# Patient Record
Sex: Female | Born: 1970 | Race: White | Hispanic: No | Marital: Married | Smoking: Former smoker
Health system: Southern US, Community
[De-identification: ages and names within clinical notes are randomized; demographics above are authoritative.]

## PROBLEM LIST (undated history)

## (undated) DIAGNOSIS — K589 Irritable bowel syndrome without diarrhea: Secondary | ICD-10-CM

## (undated) DIAGNOSIS — E039 Hypothyroidism, unspecified: Secondary | ICD-10-CM

## (undated) DIAGNOSIS — I471 Supraventricular tachycardia, unspecified: Secondary | ICD-10-CM

## (undated) DIAGNOSIS — R0789 Other chest pain: Secondary | ICD-10-CM

## (undated) DIAGNOSIS — I2510 Atherosclerotic heart disease of native coronary artery without angina pectoris: Secondary | ICD-10-CM

## (undated) DIAGNOSIS — R002 Palpitations: Secondary | ICD-10-CM

## (undated) DIAGNOSIS — M542 Cervicalgia: Secondary | ICD-10-CM

## (undated) DIAGNOSIS — S134XXA Sprain of ligaments of cervical spine, initial encounter: Secondary | ICD-10-CM

## (undated) DIAGNOSIS — E785 Hyperlipidemia, unspecified: Secondary | ICD-10-CM

## (undated) DIAGNOSIS — E669 Obesity, unspecified: Secondary | ICD-10-CM

## (undated) DIAGNOSIS — R202 Paresthesia of skin: Secondary | ICD-10-CM

## (undated) HISTORY — PX: PARTIAL HYSTERECTOMY: SHX80

## (undated) HISTORY — DX: Sprain of ligaments of cervical spine, initial encounter: S13.4XXA

## (undated) HISTORY — DX: Atherosclerotic heart disease of native coronary artery without angina pectoris: I25.10

## (undated) HISTORY — DX: Cervicalgia: M54.2

## (undated) HISTORY — DX: Paresthesia of skin: R20.2

## (undated) HISTORY — DX: Hypothyroidism, unspecified: E03.9

## (undated) HISTORY — DX: Hyperlipidemia, unspecified: E78.5

## (undated) HISTORY — DX: Irritable bowel syndrome, unspecified: K58.9

## (undated) HISTORY — DX: Palpitations: R00.2

## (undated) HISTORY — DX: Obesity, unspecified: E66.9

## (undated) HISTORY — DX: Other chest pain: R07.89

---

## 2001-05-19 DIAGNOSIS — S134XXA Sprain of ligaments of cervical spine, initial encounter: Secondary | ICD-10-CM

## 2001-05-19 HISTORY — DX: Sprain of ligaments of cervical spine, initial encounter: S13.4XXA

## 2016-05-05 ENCOUNTER — Encounter: Payer: Self-pay | Admitting: Internal Medicine

## 2016-05-14 ENCOUNTER — Other Ambulatory Visit (HOSPITAL_COMMUNITY): Payer: Self-pay | Admitting: Internal Medicine

## 2016-05-14 DIAGNOSIS — Z1231 Encounter for screening mammogram for malignant neoplasm of breast: Secondary | ICD-10-CM

## 2016-05-29 ENCOUNTER — Encounter: Payer: Self-pay | Admitting: Gastroenterology

## 2016-05-29 ENCOUNTER — Other Ambulatory Visit: Payer: Self-pay

## 2016-05-29 ENCOUNTER — Ambulatory Visit (INDEPENDENT_AMBULATORY_CARE_PROVIDER_SITE_OTHER): Payer: BLUE CROSS/BLUE SHIELD | Admitting: Gastroenterology

## 2016-05-29 VITALS — BP 125/81 | HR 91 | Temp 98.3°F | Ht 67.0 in | Wt 180.4 lb

## 2016-05-29 DIAGNOSIS — R194 Change in bowel habit: Secondary | ICD-10-CM

## 2016-05-29 DIAGNOSIS — K589 Irritable bowel syndrome without diarrhea: Secondary | ICD-10-CM | POA: Insufficient documentation

## 2016-05-29 DIAGNOSIS — Z8371 Family history of colonic polyps: Secondary | ICD-10-CM | POA: Diagnosis not present

## 2016-05-29 DIAGNOSIS — K581 Irritable bowel syndrome with constipation: Secondary | ICD-10-CM | POA: Diagnosis not present

## 2016-05-29 MED ORDER — PEG-KCL-NACL-NASULF-NA ASC-C 100 G PO SOLR: 1.0000 | ORAL | 0 refills | Status: DC

## 2016-05-29 NOTE — Patient Instructions (Signed)
No PA needed for Colonoscopy. Ref# [id number].

## 2016-05-29 NOTE — Patient Instructions (Signed)
We have scheduled you for a colonoscopy with Dr. Jena Gaussourk in the near future.  You may take Linzess 1 capsule on an empty stomach daily as needed for constipation.

## 2016-05-29 NOTE — Progress Notes (Signed)
Primary Care Physician:  Dwana MelenaZack Hall, MD Primary Gastroenterologist:  Dr. Jena Gaussourk   Chief Complaint  Patient presents with  . Irritable Bowel Syndrome    HPI:   Patricia [name] is a 46 y.o. female presenting today at the request of her PCP secondary to IBS.   Over last year or so, stool shape has changed. Usually has constipation but not always. Has a BM once day. Sometimes straining. Stool habits go back and forth. May have some loose stools occasionally but predominantly constipation. Has gas pains almost all the time. Chronic. Prescribed Bentyl from another provider but doesn't help symptoms. Will eat prunes. Will take a Senna if needed. If loose doesn't do anything. Tries to eat high fiber but makes gassier. Tries to eat grainy cereal but always suffers. Will see scant hematochezia at times, states she has a history of hemorrhoids. When seeing blood, hemorrhoids are hurting. Only once a year or so. No prior colonoscopy. Stool is flat, not formed, which concerned her. This has changed over the past year. Has gained 20 lbs over the last year. Probiotic makes her sick. Likes to avoid prescriptive agents.    Past Medical History:  Diagnosis Date  . Hypothyroidism     Past Surgical History:  Procedure Laterality Date  . CESAREAN SECTION    . PARTIAL HYSTERECTOMY      Current Outpatient Prescriptions  Medication Sig Dispense Refill  . levothyroxine (SYNTHROID, LEVOTHROID) 88 MCG tablet TAKE 1 TABLET BY MOUTH ONCE DAILY ON AN EMPTY STOMACH WITH A GLASS OF WATER AT LEAST 30-60 MINUTES BEFORE BREAKFAST     No current facility-administered medications for this visit.     Allergies as of 05/29/2016 - Review Complete 05/29/2016  Allergen Reaction Noted  . Sulfa antibiotics Other (See Comments) 01/04/2015    Family History  Problem Relation Age of Onset  . Colon polyps Father [id number]    Social History   Social History  . Marital status: Married    Spouse name: N/A  . Number of  children: N/A  . Years of education: N/A   Occupational History  . Nurse    Social History Main Topics  . Smoking status: Never Smoker  . Smokeless tobacco: Never Used  . Alcohol use Yes     Comment: socially  . Drug use: No  . Sexual activity: Not on file   Other Topics Concern  . Not on file   Social History Narrative  . No narrative on file    Review of Systems: Gen: +weight gain  CV: Denies chest pain, heart palpitations, peripheral edema, syncope.  Resp: Denies shortness of breath at rest or with exertion. Denies wheezing or cough.  GI: see HPI  GU : Denies urinary burning, urinary frequency, urinary hesitancy MS: herniated disc in neck  Derm: Denies rash, itching, dry skin Psych: Denies depression, anxiety, memory loss, and confusion Heme: see HPI   Physical Exam: BP 125/81   Pulse 91   Temp 98.3 F (36.8 C) (Oral)   Ht 5\' 7"  (1.702 m)   Wt 180 lb 6.4 oz (81.8 kg)   BMI 28.25 kg/m  General:   Alert and oriented. Pleasant and cooperative. Well-nourished and well-developed.  Head:  Normocephalic and atraumatic. Eyes:  Without icterus, sclera clear and conjunctiva pink.  Ears:  Normal auditory acuity. Nose:  No deformity, discharge,  or lesions. Lungs:  Clear to auscultation bilaterally. No wheezes, rales, or rhonchi. No distress.  Heart:  S1, S2  present without murmurs appreciated.  Abdomen:  +BS, soft, non-tender and non-distended. No HSM noted. No guarding or rebound. No masses appreciated.  Rectal:  Deferred  Msk:  Symmetrical without gross deformities. Normal posture. Extremities:  Without edema. Neurologic:  Alert and  oriented x4;  grossly normal neurologically. Psych:  Alert and cooperative. Normal mood and affect.

## 2016-05-29 NOTE — Assessment & Plan Note (Signed)
46 year old female presenting today with long-standing history of constipation-predominant IBS, and patient is concerned regarding change in bowel habits. Reports stool shape changing to appear more "flat". Chronic low-volume hematochezia noted in setting of rectal irritation only about once a year, which is likely benign anorectal source. She does report that her father has a history of numerous polyps, undergoing surveillance yearly. With her family history of polyps and change in bowel habits, will proceed with screening colonoscopy.   Proceed with TCS with Dr. Jena Gaussourk in near future: the risks, benefits, and alternatives have been discussed with the patient in detail. The patient states understanding and desires to proceed. Linzess 72 mcg once daily provided as samples. Patient would like to avoid prescriptive agents if possible, but she is willing to try this is necessary.

## 2016-05-29 NOTE — Progress Notes (Signed)
CC'D TO PCP °

## 2016-05-29 NOTE — Assessment & Plan Note (Signed)
Constipation-predominant. Colonoscopy as planned due to change in bowel habits, FH colon polyps.

## 2016-06-02 ENCOUNTER — Ambulatory Visit (HOSPITAL_COMMUNITY)
Admission: RE | Admit: 2016-06-02 | Discharge: 2016-06-02 | Disposition: A | Discharge status: Home / Self Care | Payer: BLUE CROSS/BLUE SHIELD | Source: Ambulatory Visit | Attending: Internal Medicine | Admitting: Internal Medicine

## 2016-06-02 DIAGNOSIS — Z1231 Encounter for screening mammogram for malignant neoplasm of breast: Secondary | ICD-10-CM | POA: Diagnosis not present

## 2016-06-06 ENCOUNTER — Other Ambulatory Visit: Payer: Self-pay | Admitting: Internal Medicine

## 2016-06-06 DIAGNOSIS — R928 Other abnormal and inconclusive findings on diagnostic imaging of breast: Secondary | ICD-10-CM

## 2016-06-18 ENCOUNTER — Encounter (HOSPITAL_COMMUNITY): Payer: Self-pay | Admitting: *Deleted

## 2016-06-18 ENCOUNTER — Encounter (HOSPITAL_COMMUNITY)
Admission: RE | Disposition: A | Discharge status: Home / Self Care | Payer: Self-pay | Source: Ambulatory Visit | Attending: Internal Medicine

## 2016-06-18 ENCOUNTER — Ambulatory Visit (HOSPITAL_COMMUNITY)
Admission: RE | Admit: 2016-06-18 | Discharge: 2016-06-18 | Disposition: A | Discharge status: Home / Self Care | Payer: BLUE CROSS/BLUE SHIELD | Source: Ambulatory Visit | Attending: Internal Medicine | Admitting: Internal Medicine

## 2016-06-18 DIAGNOSIS — K640 First degree hemorrhoids: Secondary | ICD-10-CM | POA: Insufficient documentation

## 2016-06-18 DIAGNOSIS — Z8371 Family history of colonic polyps: Secondary | ICD-10-CM | POA: Insufficient documentation

## 2016-06-18 DIAGNOSIS — R194 Change in bowel habit: Secondary | ICD-10-CM

## 2016-06-18 DIAGNOSIS — K589 Irritable bowel syndrome without diarrhea: Secondary | ICD-10-CM | POA: Insufficient documentation

## 2016-06-18 DIAGNOSIS — R195 Other fecal abnormalities: Secondary | ICD-10-CM | POA: Diagnosis not present

## 2016-06-18 DIAGNOSIS — E039 Hypothyroidism, unspecified: Secondary | ICD-10-CM | POA: Insufficient documentation

## 2016-06-18 DIAGNOSIS — Z83719 Family history of colon polyps, unspecified: Secondary | ICD-10-CM

## 2016-06-18 DIAGNOSIS — Z79899 Other long term (current) drug therapy: Secondary | ICD-10-CM | POA: Insufficient documentation

## 2016-06-18 HISTORY — PX: COLONOSCOPY: SHX5424

## 2016-06-18 SURGERY — COLONOSCOPY
Anesthesia: Moderate Sedation

## 2016-06-18 MED ORDER — ONDANSETRON HCL 4 MG/2ML IJ SOLN
INTRAMUSCULAR | Status: DC | PRN
  Administered 2016-06-18: 4 mg via INTRAVENOUS

## 2016-06-18 MED ORDER — MEPERIDINE HCL 100 MG/ML IJ SOLN
INTRAMUSCULAR | Status: DC | PRN
  Administered 2016-06-18: 50 mg via INTRAVENOUS
  Administered 2016-06-18 (×2): 25 mg via INTRAVENOUS
  Administered 2016-06-18: 50 mg via INTRAVENOUS

## 2016-06-18 MED ORDER — MEPERIDINE HCL 100 MG/ML IJ SOLN
INTRAMUSCULAR | Status: AC
  Filled 2016-06-18: qty 2

## 2016-06-18 MED ORDER — ONDANSETRON HCL 4 MG/2ML IJ SOLN
INTRAMUSCULAR | Status: AC
  Filled 2016-06-18: qty 2

## 2016-06-18 MED ORDER — MIDAZOLAM HCL 5 MG/5ML IJ SOLN
INTRAMUSCULAR | Status: DC | PRN
  Administered 2016-06-18 (×2): 1 mg via INTRAVENOUS
  Administered 2016-06-18: 2 mg via INTRAVENOUS
  Administered 2016-06-18: 1 mg via INTRAVENOUS
  Administered 2016-06-18: 2 mg via INTRAVENOUS

## 2016-06-18 MED ORDER — MIDAZOLAM HCL 5 MG/5ML IJ SOLN
INTRAMUSCULAR | Status: AC
  Filled 2016-06-18: qty 10

## 2016-06-18 MED ORDER — SODIUM CHLORIDE 0.9 % IV SOLN
INTRAVENOUS | Status: DC
  Administered 2016-06-18: 10:00:00 via INTRAVENOUS

## 2016-06-18 MED ORDER — STERILE WATER FOR IRRIGATION IR SOLN
Status: DC | PRN
  Administered 2016-06-18: 11:00:00

## 2016-06-18 NOTE — Progress Notes (Signed)
Patricia [name] has had sedative medications today for a procedure.  This could potentially affect a drug screen for employement.

## 2016-06-18 NOTE — Interval H&P Note (Signed)
History and Physical Interval Note:  06/18/2016 10:27 AM  Patricia [name]  has presented today for surgery, with the diagnosis of family history polyps, change in bowel habits  The various methods of treatment have been discussed with the patient and family. After consideration of risks, benefits and other options for treatment, the patient has consented to  Procedure(s) with comments: COLONOSCOPY (N/A) - 11:15 am as a surgical intervention .  The patient's history has been reviewed, patient examined, no change in status, stable for surgery.  I have reviewed the patient's chart and labs.  Questions were answered to the patient's satisfaction.     Patricia [name]  No change. Diagnostic colonoscopy for plan.  The risks, benefits, limitations, alternatives and imponderables have been reviewed with the patient. Questions have been answered. All parties are agreeable.

## 2016-06-18 NOTE — OR Nursing (Signed)
Patient's husband took patient's wedding band and placed it in his left front pants pocket

## 2016-06-18 NOTE — Op Note (Signed)
[facility] Patient Name: Patricia [name] Procedure Date: 06/18/2016 10:00 AM MRN: [id number] Date of Birth: 09-23-1970 Attending MD: Gennette Pac , MD CSN: [id number] Age: 45 Admit Type: Outpatient Procedure:                Colonoscopy - diagnostic Indications:              Change in bowel habits, Change in stool caliber Providers:                Gennette Pac, MD, Loma Messing B. Mathis Fare RN, RN,                            Dyann Ruddle Referring MD:              Medicines:                Midazolam 7 mg IV, Meperidine 150 mg IV Complications:            No immediate complications. Estimated Blood Loss:     Estimated blood loss was minimal. Procedure:                Pre-Anesthesia Assessment:                           - Prior to the procedure, a History and Physical                            was performed, and patient medications and                            allergies were reviewed. The patient's tolerance of                            previous anesthesia was also reviewed. The risks                            and benefits of the procedure and the sedation                            options and risks were discussed with the patient.                            All questions were answered, and informed consent                            was obtained. Prior Anticoagulants: The patient has                            taken no previous anticoagulant or antiplatelet                            agents. ASA Grade Assessment: II - A patient with                            mild systemic disease. After reviewing the risks  and benefits, the patient was deemed in                            satisfactory condition to undergo the procedure.                           After obtaining informed consent, the colonoscope                            was passed under direct vision. Throughout the                            procedure, the patient's blood pressure, pulse,  and                            oxygen saturations were monitored continuously. The                            EC-[id number] ([id number]) scope was introduced through                            the anus and advanced to the the cecum, identified                            by appendiceal orifice and ileocecal valve. The                            ileocecal valve, appendiceal orifice, and rectum                            were photographed. The entire colon was well                            visualized. The colonoscopy was performed without                            difficulty. The colonoscopy was performed without                            difficulty. The quality of the bowel preparation                            was adequate. Scope In: 10:45:13 AM Scope Out: 11:01:48 AM Scope Withdrawal Time: 0 hours 8 minutes 9 seconds  Total Procedure Duration: 0 hours 16 minutes 35 seconds  Findings:      The perianal and digital rectal examinations were normal.      The colon (entire examined portion) appeared normal.      No additional abnormalities were found on retroflexion.      Internal hemorrhoids were found during retroflexion. The hemorrhoids       were Grade I (internal hemorrhoids that do not prolapse). Impression:               - The entire examined colon is normal.                           -  Internal hemorrhoids.                           - No specimens collected. I suspect hemorrhoidal                            bleeding in the setting of constipation. Moderate Sedation:      Moderate (conscious) sedation was administered by the endoscopy nurse       and supervised by the endoscopist. The following parameters were       monitored: oxygen saturation, heart rate, blood pressure, respiratory       rate, EKG, adequacy of pulmonary ventilation, and response to care.       Total physician intraservice time was 9 minutes. Recommendation:           - Patient has a contact number available for                             emergencies. The signs and symptoms of potential                            delayed complications were discussed with the                            patient. Return to normal activities tomorrow.                            Written discharge instructions were provided to the                            patient.                           - Resume previous diet. Begin Benefiber 1                            tablespoon twice daily. Trial Linzess 72 daily as                            needed for constipation                           - Repeat colonoscopy in 5 years for screening                            purposes.                           - Return to GI office in 3 months.                           - Continue present medications. Procedure Code(s):        --- Professional ---                           [phone], Colonoscopy, flexible; diagnostic, including  collection of specimen(s) by brushing or washing,                            when performed (separate procedure) Diagnosis Code(s):        --- Professional ---                           K64.0, First degree hemorrhoids                           R19.4, Change in bowel habit                           R19.5, Other fecal abnormalities CPT copyright 2016 American Medical Association. All rights reserved. The codes documented in this report are preliminary and upon coder review may  be revised to meet current compliance requirements. [name]. [name], MD Gennette Pac, MD 06/18/2016 11:14:56 AM This report has been signed electronically. Number of Addenda: 0

## 2016-06-18 NOTE — Discharge Instructions (Addendum)
Colonoscopy Discharge Instructions  Read the instructions outlined below and refer to this sheet in the next few weeks. These discharge instructions provide you with general information on caring for yourself after you leave the hospital. Your doctor may also give you specific instructions. While your treatment has been planned according to the most current medical practices available, unavoidable complications occasionally occur. If you have any problems or questions after discharge, call Dr. Jena Gauss at [phone]. ACTIVITY  You may resume your regular activity, but move at a slower pace for the next 24 hours.   Take frequent rest periods for the next 24 hours.   Walking will help get rid of the air and reduce the bloated feeling in your belly (abdomen).   No driving for 24 hours (because of the medicine (anesthesia) used during the test).    Do not sign any important legal documents or operate any machinery for 24 hours (because of the anesthesia used during the test).  NUTRITION  Drink plenty of fluids.   You may resume your normal diet as instructed by your doctor.   Begin with a light meal and progress to your normal diet. Heavy or fried foods are harder to digest and may make you feel sick to your stomach (nauseated).   Avoid alcoholic beverages for 24 hours or as instructed.  MEDICATIONS  You may resume your normal medications unless your doctor tells you otherwise.  WHAT YOU CAN EXPECT TODAY  Some feelings of bloating in the abdomen.   Passage of more gas than usual.   Spotting of blood in your stool or on the toilet paper.  IF YOU HAD POLYPS REMOVED DURING THE COLONOSCOPY:  No aspirin products for 7 days or as instructed.   No alcohol for 7 days or as instructed.   Eat a soft diet for the next 24 hours.  FINDING OUT THE RESULTS OF YOUR TEST Not all test results are available during your visit. If your test results are not back during the visit, make an appointment  with your caregiver to find out the results. Do not assume everything is normal if you have not heard from your caregiver or the medical facility. It is important for you to follow up on all of your test results.  SEEK IMMEDIATE MEDICAL ATTENTION IF:  You have more than a spotting of blood in your stool.   Your belly is swollen (abdominal distention).   You are nauseated or vomiting.   You have a temperature over 101.   You have abdominal pain or discomfort that is severe or gets worse throughout the day.   Repeat colonoscopy in 5 years  Constipation and hemorrhoid  information provided  Begin Benefiber 1 tablespoon twice daily  Use Linzess 72 daily as needed for constipation  Office visit with [place] in 3 months      Constipation, Adult Constipation is when a person has fewer bowel movements in a week than normal, has difficulty having a bowel movement, or has stools that are dry, hard, or larger than normal. Constipation may be caused by an underlying condition. It may become worse with age if a person takes certain medicines and does not take in enough fluids. Follow these instructions at home: Eating and drinking  Eat foods that have a lot of fiber, such as fresh fruits and vegetables, whole grains, and beans.  Limit foods that are high in fat, low in fiber, or overly processed, such as french fries, hamburgers, cookies, candies, and soda.  Drink enough fluid to keep your urine clear or pale yellow. General instructions  Exercise regularly or as told by your health care provider.  Go to the restroom when you have the urge to go. Do not hold it in.  Take over-the-counter and prescription medicines only as told by your health care provider. These include any fiber supplements.  Practice pelvic floor retraining exercises, such as deep breathing while relaxing the lower abdomen and pelvic floor relaxation during bowel movements.  Watch your condition for any changes.  Keep  all follow-up visits as told by your health care provider. This is important. Contact a health care provider if:  You have pain that gets worse.  You have a fever.  You do not have a bowel movement after 4 days.  You vomit.  You are not hungry.  You lose weight.  You are bleeding from the anus.  You have thin, pencil-like stools. Get help right away if:  You have a fever and your symptoms suddenly get worse.  You leak stool or have blood in your stool.  Your abdomen is bloated.  You have severe pain in your abdomen.  You feel dizzy or you faint. This information is not intended to replace advice given to you by your health care provider. Make sure you discuss any questions you have with your health care provider. Document Released: 02/01/2004 Document Revised: 11/23/2015 Document Reviewed: 10/24/2015 Elsevier Interactive Patient Education  2017 [employer]    Hemorrhoids Hemorrhoids are swollen veins in and around the rectum or anus. There are two types of hemorrhoids:  Internal hemorrhoids. These occur in the veins that are just inside the rectum. They may poke through to the outside and become irritated and painful.  External hemorrhoids. These occur in the veins that are outside of the anus and can be felt as a painful swelling or hard lump near the anus. Most hemorrhoids do not cause serious problems, and they can be managed with home treatments such as diet and lifestyle changes. If home treatments do not help your symptoms, procedures can be done to shrink or remove the hemorrhoids. What are the causes? This condition is caused by increased pressure in the anal area. This pressure may result from various things, including:  Constipation.  Straining to have a bowel movement.  Diarrhea.  Pregnancy.  Obesity.  Sitting for long periods of time.  Heavy lifting or other activity that causes you to strain.  Anal sex. What are the signs or  symptoms? Symptoms of this condition include:  Pain.  Anal itching or irritation.  Rectal bleeding.  Leakage of stool (feces).  Anal swelling.  One or more lumps around the anus. How is this diagnosed? This condition can often be diagnosed through a visual exam. Other exams or tests may also be done, such as:  Examination of the rectal area with a gloved hand (digital rectal exam).  Examination of the anal canal using a small tube (anoscope).  A blood test, if you have lost a significant amount of blood.  A test to look inside the colon (sigmoidoscopy or colonoscopy). How is this treated? This condition can usually be treated at home. However, various procedures may be done if dietary changes, lifestyle changes, and other home treatments do not help your symptoms. These procedures can help make the hemorrhoids smaller or remove them completely. Some of these procedures involve surgery, and others do not. Common procedures include:  Rubber band ligation. Rubber bands are placed at  the base of the hemorrhoids to cut off the blood supply to them.  Sclerotherapy. Medicine is injected into the hemorrhoids to shrink them.  Infrared coagulation. A type of light energy is used to get rid of the hemorrhoids.  Hemorrhoidectomy surgery. The hemorrhoids are surgically removed, and the veins that supply them are tied off.  Stapled hemorrhoidopexy surgery. A circular stapling device is used to remove the hemorrhoids and use staples to cut off the blood supply to them. Follow these instructions at home: Eating and drinking  Eat foods that have a lot of fiber in them, such as whole grains, beans, nuts, fruits, and vegetables. Ask your health care provider about taking products that have added fiber (fiber supplements).  Drink enough fluid to keep your urine clear or pale yellow. Managing pain and swelling  Take warm sitz baths for 20 minutes, 3-4 times a day to ease pain and  discomfort.  If directed, apply ice to the affected area. Using ice packs between sitz baths may be helpful.  Put ice in a plastic bag.  Place a towel between your skin and the bag.  Leave the ice on for 20 minutes, 2-3 times a day. General instructions  Take over-the-counter and prescription medicines only as told by your health care provider.  Use medicated creams or suppositories as told.  Exercise regularly.  Go to the bathroom when you have the urge to have a bowel movement. Do not wait.  Avoid straining to have bowel movements.  Keep the anal area dry and clean. Use wet toilet paper or moist towelettes after a bowel movement.  Do not sit on the toilet for long periods of time. This increases blood pooling and pain. Contact a health care provider if:  You have increasing pain and swelling that are not controlled by treatment or medicine.  You have uncontrolled bleeding.  You have difficulty having a bowel movement, or you are unable to have a bowel movement.  You have pain or inflammation outside the area of the hemorrhoids. This information is not intended to replace advice given to you by your health care provider. Make sure you discuss any questions you have with your health care provider. Document Released: 05/02/2000 Document Revised: 10/03/2015 Document Reviewed: 01/17/2015 Elsevier Interactive Patient Education  2017 [employer]

## 2016-06-18 NOTE — H&P (View-Only) (Signed)
Primary Care Physician:  Dwana MelenaZack Hall, MD Primary Gastroenterologist:  Dr. Jena Gaussourk   Chief Complaint  Patient presents with  . Irritable Bowel Syndrome    HPI:   Patricia [name] is a 46 y.o. female presenting today at the request of her PCP secondary to IBS.   Over last year or so, stool shape has changed. Usually has constipation but not always. Has a BM once day. Sometimes straining. Stool habits go back and forth. May have some loose stools occasionally but predominantly constipation. Has gas pains almost all the time. Chronic. Prescribed Bentyl from another provider but doesn't help symptoms. Will eat prunes. Will take a Senna if needed. If loose doesn't do anything. Tries to eat high fiber but makes gassier. Tries to eat grainy cereal but always suffers. Will see scant hematochezia at times, states she has a history of hemorrhoids. When seeing blood, hemorrhoids are hurting. Only once a year or so. No prior colonoscopy. Stool is flat, not formed, which concerned her. This has changed over the past year. Has gained 20 lbs over the last year. Probiotic makes her sick. Likes to avoid prescriptive agents.    Past Medical History:  Diagnosis Date  . Hypothyroidism     Past Surgical History:  Procedure Laterality Date  . CESAREAN SECTION    . PARTIAL HYSTERECTOMY      Current Outpatient Prescriptions  Medication Sig Dispense Refill  . levothyroxine (SYNTHROID, LEVOTHROID) 88 MCG tablet TAKE 1 TABLET BY MOUTH ONCE DAILY ON AN EMPTY STOMACH WITH A GLASS OF WATER AT LEAST 30-60 MINUTES BEFORE BREAKFAST     No current facility-administered medications for this visit.     Allergies as of 05/29/2016 - Review Complete 05/29/2016  Allergen Reaction Noted  . Sulfa antibiotics Other (See Comments) 01/04/2015    Family History  Problem Relation Age of Onset  . Colon polyps Father [id number]    Social History   Social History  . Marital status: Married    Spouse name: N/A  . Number of  children: N/A  . Years of education: N/A   Occupational History  . Nurse    Social History Main Topics  . Smoking status: Never Smoker  . Smokeless tobacco: Never Used  . Alcohol use Yes     Comment: socially  . Drug use: No  . Sexual activity: Not on file   Other Topics Concern  . Not on file   Social History Narrative  . No narrative on file    Review of Systems: Gen: +weight gain  CV: Denies chest pain, heart palpitations, peripheral edema, syncope.  Resp: Denies shortness of breath at rest or with exertion. Denies wheezing or cough.  GI: see HPI  GU : Denies urinary burning, urinary frequency, urinary hesitancy MS: herniated disc in neck  Derm: Denies rash, itching, dry skin Psych: Denies depression, anxiety, memory loss, and confusion Heme: see HPI   Physical Exam: BP 125/81   Pulse 91   Temp 98.3 F (36.8 C) (Oral)   Ht 5\' 7"  (1.702 m)   Wt 180 lb 6.4 oz (81.8 kg)   BMI 28.25 kg/m  General:   Alert and oriented. Pleasant and cooperative. Well-nourished and well-developed.  Head:  Normocephalic and atraumatic. Eyes:  Without icterus, sclera clear and conjunctiva pink.  Ears:  Normal auditory acuity. Nose:  No deformity, discharge,  or lesions. Lungs:  Clear to auscultation bilaterally. No wheezes, rales, or rhonchi. No distress.  Heart:  S1, S2  present without murmurs appreciated.  Abdomen:  +BS, soft, non-tender and non-distended. No HSM noted. No guarding or rebound. No masses appreciated.  Rectal:  Deferred  Msk:  Symmetrical without gross deformities. Normal posture. Extremities:  Without edema. Neurologic:  Alert and  oriented x4;  grossly normal neurologically. Psych:  Alert and cooperative. Normal mood and affect.

## 2016-06-19 ENCOUNTER — Encounter (HOSPITAL_COMMUNITY): Payer: Self-pay | Admitting: Internal Medicine

## 2016-06-24 ENCOUNTER — Ambulatory Visit (HOSPITAL_COMMUNITY)
Admission: RE | Admit: 2016-06-24 | Discharge: 2016-06-24 | Disposition: A | Discharge status: Home / Self Care | Payer: BLUE CROSS/BLUE SHIELD | Source: Ambulatory Visit | Attending: Internal Medicine | Admitting: Internal Medicine

## 2016-06-24 DIAGNOSIS — N6320 Unspecified lump in the left breast, unspecified quadrant: Secondary | ICD-10-CM | POA: Diagnosis present

## 2016-06-24 DIAGNOSIS — R928 Other abnormal and inconclusive findings on diagnostic imaging of breast: Secondary | ICD-10-CM

## 2016-09-09 ENCOUNTER — Ambulatory Visit: Payer: BLUE CROSS/BLUE SHIELD | Admitting: Gastroenterology

## 2017-09-23 ENCOUNTER — Other Ambulatory Visit (HOSPITAL_COMMUNITY): Payer: Self-pay | Admitting: Internal Medicine

## 2017-09-23 DIAGNOSIS — Z1231 Encounter for screening mammogram for malignant neoplasm of breast: Secondary | ICD-10-CM

## 2017-10-16 ENCOUNTER — Encounter (HOSPITAL_COMMUNITY): Payer: Self-pay

## 2017-10-16 ENCOUNTER — Ambulatory Visit (HOSPITAL_COMMUNITY)
Admission: RE | Admit: 2017-10-16 | Discharge: 2017-10-16 | Disposition: A | Discharge status: Home / Self Care | Payer: BC Managed Care – PPO | Source: Ambulatory Visit | Attending: Internal Medicine | Admitting: Internal Medicine

## 2017-10-16 DIAGNOSIS — Z1231 Encounter for screening mammogram for malignant neoplasm of breast: Secondary | ICD-10-CM | POA: Diagnosis present

## 2018-01-22 ENCOUNTER — Ambulatory Visit: Payer: BC Managed Care – PPO | Admitting: Diagnostic Neuroimaging

## 2018-03-04 ENCOUNTER — Encounter: Payer: Self-pay | Admitting: *Deleted

## 2018-03-05 ENCOUNTER — Ambulatory Visit: Payer: BC Managed Care – PPO | Admitting: Diagnostic Neuroimaging

## 2018-03-05 ENCOUNTER — Encounter: Payer: Self-pay | Admitting: Diagnostic Neuroimaging

## 2018-03-05 VITALS — BP 133/85 | HR 97 | Ht 67.0 in | Wt 184.0 lb

## 2018-03-05 DIAGNOSIS — R200 Anesthesia of skin: Secondary | ICD-10-CM | POA: Diagnosis not present
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
## 2018-03-08 ENCOUNTER — Telehealth: Payer: Self-pay | Admitting: Diagnostic Neuroimaging

## 2018-03-08 NOTE — Telephone Encounter (Signed)
lvm for pt to call back about scheduling mri  [employer] Auth: [id number] (exp. 03/08/18 to 04/06/18)

## 2018-03-09 NOTE — Telephone Encounter (Signed)
Patient returned my call stating she needs an open MRI. I informed her I would fax the order to [facility] and they will reach out to there to schedule.

## 2018-04-01 ENCOUNTER — Ambulatory Visit (INDEPENDENT_AMBULATORY_CARE_PROVIDER_SITE_OTHER): Payer: BC Managed Care – PPO | Admitting: Diagnostic Neuroimaging

## 2018-04-01 ENCOUNTER — Encounter: Payer: BC Managed Care – PPO | Admitting: Diagnostic Neuroimaging

## 2018-04-01 DIAGNOSIS — R200 Anesthesia of skin: Secondary | ICD-10-CM

## 2018-04-01 DIAGNOSIS — Z0289 Encounter for other administrative examinations: Secondary | ICD-10-CM

## 2018-04-01 NOTE — Procedures (Signed)
[facility]  NCS (NERVE CONDUCTION STUDY) WITH EMG (ELECTROMYOGRAPHY) REPORT   STUDY DATE: 04/01/18 PATIENT NAME: Patricia [name] DOB: Oct 24, 1970 MRN: [id number]  ORDERING CLINICIAN: Joycelyn SchmidVikram Penumalli, MD   TECHNOLOGIST: Charlesetta IvoryBeau Handy  ELECTROMYOGRAPHER: Glenford BayleyVikram R. Penumalli, MD  CLINICAL INFORMATION: 47 year old female here for evaluation of upper extremity numbness (left worse than right) when raising arms above head.  FINDINGS: NERVE CONDUCTION STUDY: Bilateral median and ulnar motor responses are normal.  Bilateral ulnar F-wave latencies are normal.  Bilateral median and ulnar sensory responses are normal.   NEEDLE ELECTROMYOGRAPHY:  Needle examination of left upper extremity and left cervical paraspinal muscles is normal.   IMPRESSION:   This is a normal study.  No electrodiagnostic evidence of large fiber neuropathy or left cervical radiculopathy at this time.    INTERPRETING PHYSICIAN:  Suanne MarkerVIKRAM R. PENUMALLI, MD Certified in Neurology, Neurophysiology and Neuroimaging  [facility] [address], [address] [place], [place] [address] [phone]   [facility]    Nerve / Sites Muscle Latency Ref. Amplitude Ref. Rel Amp Segments Distance Velocity Ref. Area    ms ms mV mV %  cm m/s m/s mVms  R Median - APB     Wrist APB 3.5 ?4.4 5.6 ?4.0 100 Wrist - APB 7   19.4     Upper arm APB 7.6  5.6  101 Upper arm - Wrist 22 55 ?49 19.7  L Median - APB     Wrist APB 3.4 ?4.4 12.2 ?4.0 100 Wrist - APB 7   46.8     Upper arm APB 7.3  11.5  94.3 Upper arm - Wrist 21 54 ?49 43.5  R Ulnar - ADM     Wrist ADM 3.0 ?3.3 10.9 ?6.0 100 Wrist - ADM 7   29.4     B.Elbow ADM 6.3  10.1  92.2 B.Elbow - Wrist 19 58 ?49 30.8     A.Elbow ADM 8.1  9.4  93.7 A.Elbow - B.Elbow 10 55 ?49 29.7         A.Elbow - Wrist      L Ulnar - ADM     Wrist ADM 2.6 ?3.3 14.7 ?6.0 100 Wrist - ADM 7   43.2     B.Elbow ADM 5.9  13.0  88.3 B.Elbow - Wrist 19 57 ?49 39.6   A.Elbow ADM 7.8  12.6  97.4 A.Elbow - B.Elbow 10 55 ?49 39.5         A.Elbow - Wrist                 SNC    Nerve / Sites Rec. Site Peak Lat Ref.  Amp Ref. Segments Distance    ms ms V V  cm  R Median - Orthodromic (Dig II, Mid palm)     Dig II Wrist 3.3 ?3.4 25 ?10 Dig II - Wrist 13  L Median - Orthodromic (Dig II, Mid palm)     Dig II Wrist 3.3 ?3.4 20 ?10 Dig II - Wrist 13  R Ulnar - Orthodromic, (Dig V, Mid palm)     Dig V Wrist 2.9 ?3.1 7 ?5 Dig V - Wrist 11  L Ulnar - Orthodromic, (Dig V, Mid palm)     Dig V Wrist 2.9 ?3.1 11 ?5 Dig V - Wrist 7611              F  Wave    Nerve F Lat Ref.   ms ms  R Ulnar - ADM 27.2 ?32.0  L Ulnar - ADM 26.6 ?32.0         EMG full       EMG Summary Table    Spontaneous MUAP Recruitment  Muscle IA Fib PSW Fasc Other Amp Dur. Poly Pattern  L. Deltoid Normal None None None _______ Normal Normal Normal Normal  L. Biceps brachii Normal None None None _______ Normal Normal Normal Normal  L. Triceps brachii Normal None None None _______ Normal Normal Normal Normal  L. Flexor carpi radialis Normal None None None _______ Normal Normal Normal Normal  L. First dorsal interosseous Normal None None None _______ Normal Normal Normal Normal  L. Cervical paraspinals Normal None None None _______ Normal Normal Normal Normal

## 2018-11-09 ENCOUNTER — Other Ambulatory Visit (HOSPITAL_COMMUNITY): Payer: Self-pay | Admitting: Internal Medicine

## 2018-11-09 DIAGNOSIS — Z1231 Encounter for screening mammogram for malignant neoplasm of breast: Secondary | ICD-10-CM

## 2018-12-01 ENCOUNTER — Ambulatory Visit (HOSPITAL_COMMUNITY)
Admission: RE | Admit: 2018-12-01 | Discharge: 2018-12-01 | Disposition: A | Discharge status: Home / Self Care | Payer: BC Managed Care – PPO | Source: Ambulatory Visit | Attending: Internal Medicine | Admitting: Internal Medicine

## 2018-12-01 ENCOUNTER — Other Ambulatory Visit: Payer: Self-pay

## 2018-12-01 DIAGNOSIS — Z1231 Encounter for screening mammogram for malignant neoplasm of breast: Secondary | ICD-10-CM | POA: Diagnosis not present

## 2019-06-01 ENCOUNTER — Other Ambulatory Visit: Payer: Self-pay | Admitting: Internal Medicine

## 2019-06-01 ENCOUNTER — Other Ambulatory Visit (HOSPITAL_COMMUNITY): Payer: Self-pay | Admitting: Internal Medicine

## 2019-06-01 DIAGNOSIS — I6529 Occlusion and stenosis of unspecified carotid artery: Secondary | ICD-10-CM

## 2019-06-07 ENCOUNTER — Other Ambulatory Visit: Payer: Self-pay

## 2019-06-07 ENCOUNTER — Ambulatory Visit (HOSPITAL_COMMUNITY)
Admission: RE | Admit: 2019-06-07 | Discharge: 2019-06-07 | Disposition: A | Discharge status: Home / Self Care | Payer: BC Managed Care – PPO | Source: Ambulatory Visit | Attending: Internal Medicine | Admitting: Internal Medicine

## 2019-06-07 DIAGNOSIS — I6529 Occlusion and stenosis of unspecified carotid artery: Secondary | ICD-10-CM | POA: Diagnosis not present

## 2019-06-20 ENCOUNTER — Encounter: Payer: Self-pay | Admitting: Cardiology

## 2019-06-20 ENCOUNTER — Ambulatory Visit (INDEPENDENT_AMBULATORY_CARE_PROVIDER_SITE_OTHER): Payer: BC Managed Care – PPO | Admitting: Cardiology

## 2019-06-20 ENCOUNTER — Other Ambulatory Visit: Payer: Self-pay

## 2019-06-20 ENCOUNTER — Ambulatory Visit (INDEPENDENT_AMBULATORY_CARE_PROVIDER_SITE_OTHER): Payer: BC Managed Care – PPO

## 2019-06-20 VITALS — BP 137/92 | HR 91 | Temp 98.0°F | Ht 67.0 in | Wt 199.0 lb

## 2019-06-20 DIAGNOSIS — R002 Palpitations: Secondary | ICD-10-CM
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
## 2019-06-20 NOTE — Patient Instructions (Signed)
Medication Instructions:  Your physician recommends that you continue on your current medications as directed. Please refer to the Current Medication list given to you today.   Labwork: NONE  Testing/Procedures: Your physician has recommended that you wear an event monitor. Event monitors are medical devices that record the heart's electrical activity. Doctors most often us these monitors to diagnose arrhythmias. Arrhythmias are problems with the speed or rhythm of the heartbeat. The monitor is a small, portable device. You can wear one while you do your normal daily activities. This is usually used to diagnose what is causing palpitations/syncope (passing out).  (14 DAYS)     Follow-Up: Your physician recommends that you schedule a follow-up appointment in: 6 WEEKS    Any Other Special Instructions Will Be Listed Below (If Applicable).     If you need a refill on your cardiac medications before your next appointment, please call your pharmacy.   

## 2019-07-12 ENCOUNTER — Other Ambulatory Visit: Payer: Self-pay

## 2019-08-05 ENCOUNTER — Encounter: Payer: Self-pay | Admitting: Cardiology

## 2019-08-05 ENCOUNTER — Telehealth (INDEPENDENT_AMBULATORY_CARE_PROVIDER_SITE_OTHER): Payer: BC Managed Care – PPO | Admitting: Cardiology

## 2019-08-05 VITALS — BP 120/84 | HR 86 | Ht 67.0 in | Wt 197.0 lb

## 2019-08-05 DIAGNOSIS — R002 Palpitations: Secondary | ICD-10-CM | POA: Diagnosis not present

## 2019-08-05 DIAGNOSIS — I471 Supraventricular tachycardia: Secondary | ICD-10-CM | POA: Diagnosis not present

## 2019-08-05 MED ORDER — METOPROLOL TARTRATE 25 MG PO TABS: 12.5000 mg | ORAL_TABLET | Freq: Two times a day (BID) | ORAL | 3 refills | Status: DC
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
## 2019-08-05 NOTE — Patient Instructions (Signed)
Medication Instructions:  START LOPRESSOR 12.5 MG - TWO TIMES DAILY   Labwork: NONE  Testing/Procedures: NONE  Follow-Up: Your physician wants you to follow-up in: 6 MONTHS.  You will receive a reminder letter in the mail two months in advance. If you don't receive a letter, please call our office to schedule the follow-up appointment.   Any Other Special Instructions Will Be Listed Below (If Applicable).     If you need a refill on your cardiac medications before your next appointment, please call your pharmacy.

## 2019-08-05 NOTE — Addendum Note (Signed)
Addended by: Abelino Derrick R on: 08/05/2019 04:24 PM   Modules accepted: Orders

## 2019-10-26 ENCOUNTER — Telehealth: Payer: Self-pay

## 2019-10-26 NOTE — Telephone Encounter (Signed)
Pt would like to update Dr.Branch on how she is feeling since last medication changes.   Please call [phone]   Thanks renee

## 2019-10-26 NOTE — Telephone Encounter (Signed)
Pt states that she is doing well. She reports that she is taking Lopressor 25 mg in the morning and at night and taking 12.5 mg in the midday. Please advise.

## 2019-10-26 NOTE — Telephone Encounter (Signed)
If feeling well on current regimen then we will continue, if recurrent symptoms let us know and we can make further adjstements.   Dominga Ferry MD

## 2019-10-27 MED ORDER — METOPROLOL TARTRATE 25 MG PO TABS: 25.0000 mg | ORAL_TABLET | Freq: Two times a day (BID) | ORAL | 3 refills | Status: DC

## 2019-10-27 NOTE — Addendum Note (Signed)
Addended by: Kerney Elbe on: 10/27/2019 12:58 PM   Modules accepted: Orders

## 2019-12-06 ENCOUNTER — Other Ambulatory Visit (HOSPITAL_COMMUNITY): Payer: Self-pay | Admitting: Internal Medicine

## 2019-12-06 DIAGNOSIS — Z1231 Encounter for screening mammogram for malignant neoplasm of breast: Secondary | ICD-10-CM

## 2019-12-16 ENCOUNTER — Other Ambulatory Visit: Payer: Self-pay

## 2019-12-16 ENCOUNTER — Ambulatory Visit (HOSPITAL_COMMUNITY)
Admission: RE | Admit: 2019-12-16 | Discharge: 2019-12-16 | Disposition: A | Discharge status: Home / Self Care | Payer: BC Managed Care – PPO | Source: Ambulatory Visit | Attending: Internal Medicine | Admitting: Internal Medicine

## 2019-12-16 DIAGNOSIS — Z1231 Encounter for screening mammogram for malignant neoplasm of breast: Secondary | ICD-10-CM | POA: Diagnosis not present

## 2020-01-17 ENCOUNTER — Ambulatory Visit
Admission: EM | Admit: 2020-01-17 | Discharge: 2020-01-17 | Disposition: A | Discharge status: Home / Self Care | Payer: BC Managed Care – PPO | Attending: Emergency Medicine | Admitting: Emergency Medicine

## 2020-01-17 ENCOUNTER — Other Ambulatory Visit: Payer: Self-pay

## 2020-01-17 DIAGNOSIS — T63461A Toxic effect of venom of wasps, accidental (unintentional), initial encounter: Secondary | ICD-10-CM

## 2020-01-17 DIAGNOSIS — S80861A Insect bite (nonvenomous), right lower leg, initial encounter: Secondary | ICD-10-CM

## 2020-01-17 DIAGNOSIS — S80862A Insect bite (nonvenomous), left lower leg, initial encounter: Secondary | ICD-10-CM

## 2020-01-17 MED ORDER — PREDNISONE 20 MG PO TABS: 20.0000 mg | ORAL_TABLET | Freq: Two times a day (BID) | ORAL | 0 refills | Status: AC

## 2020-01-17 MED ORDER — DEXAMETHASONE SODIUM PHOSPHATE 10 MG/ML IJ SOLN
10.0000 mg | Freq: Once | INTRAMUSCULAR | Status: AC
  Administered 2020-01-17: 10 mg via INTRAMUSCULAR

## 2020-01-17 MED ORDER — DOXYCYCLINE HYCLATE 100 MG PO CAPS
100.0000 mg | ORAL_CAPSULE | Freq: Two times a day (BID) | ORAL | 0 refills | Status: DC
Start: 2020-01-17 — End: 2020-07-18
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
## 2020-01-17 NOTE — Discharge Instructions (Signed)
Steroid shot given  Prednisone prescribed Doxycycline prescribed Wash with warm water and mild soap Follow up with PCP if symptoms persists Return or go to the ER if you have any new or worsening symptoms such as fever, chills, nausea, vomiting, redness, swelling, discharge, if symptoms do not improve with medications, etc..Marland Kitchen

## 2020-01-17 NOTE — ED Triage Notes (Signed)
See provider note. 

## 2020-01-19 ENCOUNTER — Encounter: Payer: Self-pay | Admitting: Cardiology

## 2020-01-19 ENCOUNTER — Encounter: Payer: Self-pay | Admitting: *Deleted

## 2020-01-19 ENCOUNTER — Other Ambulatory Visit: Payer: Self-pay

## 2020-01-19 ENCOUNTER — Ambulatory Visit (INDEPENDENT_AMBULATORY_CARE_PROVIDER_SITE_OTHER): Payer: BC Managed Care – PPO | Admitting: Cardiology

## 2020-01-19 VITALS — BP 124/68 | HR 75 | Ht 67.0 in | Wt 211.0 lb

## 2020-01-19 DIAGNOSIS — R002 Palpitations: Secondary | ICD-10-CM | POA: Diagnosis not present

## 2020-01-19 DIAGNOSIS — I471 Supraventricular tachycardia: Secondary | ICD-10-CM | POA: Diagnosis not present

## 2020-01-19 MED ORDER — METOPROLOL TARTRATE 25 MG PO TABS
37.5000 mg | ORAL_TABLET | Freq: Three times a day (TID) | ORAL | 11 refills | Status: DC
Start: 2020-01-19 — End: 2020-07-18

## 2020-01-19 NOTE — Patient Instructions (Signed)
Medication Instructions:  Your physician recommends that you continue on your current medications as directed. Please refer to the Current Medication list given to you today.  Take Lopressor 37.5 mg Three Times Daily   *If you need a refill on your cardiac medications before your next appointment, please call your pharmacy*   Lab Work: NONE   If you have labs (blood work) drawn today and your tests are completely normal, you will receive your results only by: Marland Kitchen MyChart Message (if you have MyChart) OR . A paper copy in the mail If you have any lab test that is abnormal or we need to change your treatment, we will call you to review the results.   Testing/Procedures: NONE   Follow-Up: At [facility], you and your health needs are our priority.  As part of our continuing mission to provide you with exceptional heart care, we have created designated Provider Care Teams.  These Care Teams include your primary Cardiologist (physician) and Advanced Practice Providers (APPs -  Physician Assistants and Nurse Practitioners) who all work together to provide you with the care you need, when you need it.  We recommend signing up for the patient portal called "MyChart".  Sign up information is provided on this After Visit Summary.  MyChart is used to connect with patients for Virtual Visits (Telemedicine).  Patients are able to view lab/test results, encounter notes, upcoming appointments, etc.  Non-urgent messages can be sent to your provider as well.   To learn more about what you can do with MyChart, go to [email or web address].    Your next appointment:   6 month(s)  The format for your next appointment:   In Person  Provider:   Dina Rich, MD   Other Instructions Thank you for choosing [facility]!
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
## 2020-03-15 ENCOUNTER — Ambulatory Visit: Payer: BC Managed Care – PPO | Attending: Internal Medicine

## 2020-03-15 DIAGNOSIS — Z23 Encounter for immunization: Secondary | ICD-10-CM

## 2020-03-15 NOTE — Progress Notes (Signed)
   Covid-19 Vaccination Clinic  Name:  Patricia [name]    MRN: [id number] DOB: 02/16/1971  03/15/2020  Patricia [name] was observed post Covid-19 immunization for 15 minutes without incident. She was provided with Vaccine Information Sheet and instruction to access the V-Safe system.   Patricia [name] was instructed to call 911 with any severe reactions post vaccine: Marland Kitchen Difficulty breathing  . Swelling of face and throat  . A fast heartbeat  . A bad rash all over body  . Dizziness and weakness

## 2020-07-18 ENCOUNTER — Ambulatory Visit: Payer: BC Managed Care – PPO | Admitting: Cardiology

## 2020-07-18 ENCOUNTER — Encounter: Payer: Self-pay | Admitting: Cardiology

## 2020-07-18 ENCOUNTER — Other Ambulatory Visit: Payer: Self-pay

## 2020-07-18 VITALS — BP 140/88 | HR 73 | Ht 67.0 in | Wt 215.0 lb

## 2020-07-18 DIAGNOSIS — I471 Supraventricular tachycardia: Secondary | ICD-10-CM | POA: Diagnosis not present

## 2020-07-18 DIAGNOSIS — R002 Palpitations: Secondary | ICD-10-CM | POA: Diagnosis not present

## 2020-07-18 MED ORDER — METOPROLOL SUCCINATE ER 100 MG PO TB24: 100.0000 mg | ORAL_TABLET | Freq: Every day | ORAL | 3 refills | Status: DC

## 2020-07-18 MED ORDER — METOPROLOL TARTRATE 25 MG PO TABS: 25.0000 mg | ORAL_TABLET | Freq: Three times a day (TID) | ORAL | 3 refills | Status: DC | PRN

## 2020-07-18 NOTE — Patient Instructions (Signed)
Medication Instructions:  Your physician has recommended you make the following change in your medication:   Take Lopressor 25 mg three times daily as needed  Start Toprol XL 100 mg Daily   *If you need a refill on your cardiac medications before your next appointment, please call your pharmacy*   Lab Work: NONE   If you have labs (blood work) drawn today and your tests are completely normal, you will receive your results only by: Marland Kitchen MyChart Message (if you have MyChart) OR . A paper copy in the mail If you have any lab test that is abnormal or we need to change your treatment, we will call you to review the results.   Testing/Procedures: NONE    Follow-Up: At [facility], you and your health needs are our priority.  As part of our continuing mission to provide you with exceptional heart care, we have created designated Provider Care Teams.  These Care Teams include your primary Cardiologist (physician) and Advanced Practice Providers (APPs -  Physician Assistants and Nurse Practitioners) who all work together to provide you with the care you need, when you need it.  We recommend signing up for the patient portal called "MyChart".  Sign up information is provided on this After Visit Summary.  MyChart is used to connect with patients for Virtual Visits (Telemedicine).  Patients are able to view lab/test results, encounter notes, upcoming appointments, etc.  Non-urgent messages can be sent to your provider as well.   To learn more about what you can do with MyChart, go to [email or web address].    Your next appointment:   6 month(s)  The format for your next appointment:   In Person  Provider:   Dina Rich, MD   Other Instructions Thank you for choosing [facility]!
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
## 2020-07-18 NOTE — Addendum Note (Signed)
Addended by: Kerney Elbe on: 07/18/2020 03:33 PM   Modules accepted: Orders

## 2020-08-13 ENCOUNTER — Other Ambulatory Visit: Payer: Self-pay

## 2020-08-13 MED ORDER — METOPROLOL TARTRATE 25 MG PO TABS: ORAL_TABLET | ORAL | 3 refills | Status: DC

## 2020-08-13 NOTE — Progress Notes (Signed)
Medication change from Toprol XL 100 mg 24 hr tablet to Lopressor 25 mg TID with extra 25 mg PRN for palpitations per Dominga Ferry, MD.

## 2020-11-06 ENCOUNTER — Other Ambulatory Visit (HOSPITAL_COMMUNITY): Payer: Self-pay | Admitting: Internal Medicine

## 2020-11-06 DIAGNOSIS — Z1231 Encounter for screening mammogram for malignant neoplasm of breast: Secondary | ICD-10-CM

## 2020-11-27 ENCOUNTER — Other Ambulatory Visit: Payer: Self-pay | Admitting: Cardiology

## 2020-12-02 ENCOUNTER — Ambulatory Visit
Admission: RE | Admit: 2020-12-02 | Discharge: 2020-12-02 | Disposition: A | Discharge status: Home / Self Care | Payer: BC Managed Care – PPO | Source: Ambulatory Visit | Attending: Family Medicine | Admitting: Family Medicine

## 2020-12-02 ENCOUNTER — Other Ambulatory Visit: Payer: Self-pay

## 2020-12-02 VITALS — BP 125/83 | HR 100 | Temp 99.3°F | Resp 18

## 2020-12-02 DIAGNOSIS — T63443A Toxic effect of venom of bees, assault, initial encounter: Secondary | ICD-10-CM

## 2020-12-02 DIAGNOSIS — T63453A Toxic effect of venom of hornets, assault, initial encounter: Secondary | ICD-10-CM

## 2020-12-02 DIAGNOSIS — T63463A Toxic effect of venom of wasps, assault, initial encounter: Secondary | ICD-10-CM | POA: Diagnosis not present

## 2020-12-02 DIAGNOSIS — L03119 Cellulitis of unspecified part of limb: Secondary | ICD-10-CM | POA: Diagnosis not present

## 2020-12-02 DIAGNOSIS — R21 Rash and other nonspecific skin eruption: Secondary | ICD-10-CM

## 2020-12-02 HISTORY — DX: Supraventricular tachycardia, unspecified: I47.10

## 2020-12-02 HISTORY — DX: Supraventricular tachycardia: I47.1

## 2020-12-02 MED ORDER — DOXYCYCLINE HYCLATE 100 MG PO CAPS: 100.0000 mg | ORAL_CAPSULE | Freq: Two times a day (BID) | ORAL | 0 refills | Status: DC

## 2020-12-02 MED ORDER — DEXAMETHASONE SODIUM PHOSPHATE 10 MG/ML IJ SOLN
10.0000 mg | Freq: Once | INTRAMUSCULAR | Status: AC
  Administered 2020-12-02: 10 mg via INTRAMUSCULAR

## 2020-12-02 MED ORDER — PREDNISONE 20 MG PO TABS: 40.0000 mg | ORAL_TABLET | Freq: Every day | ORAL | 0 refills | Status: AC

## 2020-12-02 MED ORDER — TRIAMCINOLONE ACETONIDE 0.1 % EX CREA: 1.0000 "application " | TOPICAL_CREAM | Freq: Two times a day (BID) | CUTANEOUS | 0 refills | Status: DC

## 2020-12-02 NOTE — ED Triage Notes (Signed)
Stung by several wasp yesterday evening on legs and RT arm
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
## 2020-12-17 ENCOUNTER — Other Ambulatory Visit: Payer: Self-pay

## 2020-12-17 ENCOUNTER — Ambulatory Visit (HOSPITAL_COMMUNITY)
Admission: RE | Admit: 2020-12-17 | Discharge: 2020-12-17 | Disposition: A | Discharge status: Home / Self Care | Payer: BC Managed Care – PPO | Source: Ambulatory Visit | Attending: Internal Medicine | Admitting: Internal Medicine

## 2020-12-17 DIAGNOSIS — Z1231 Encounter for screening mammogram for malignant neoplasm of breast: Secondary | ICD-10-CM | POA: Insufficient documentation

## 2021-01-23 ENCOUNTER — Other Ambulatory Visit: Payer: Self-pay

## 2021-01-23 ENCOUNTER — Ambulatory Visit (INDEPENDENT_AMBULATORY_CARE_PROVIDER_SITE_OTHER): Payer: BC Managed Care – PPO | Admitting: Cardiology

## 2021-01-23 ENCOUNTER — Encounter: Payer: Self-pay | Admitting: Cardiology

## 2021-01-23 VITALS — BP 122/80 | HR 88 | Ht 67.0 in | Wt 207.2 lb

## 2021-01-23 DIAGNOSIS — R002 Palpitations: Secondary | ICD-10-CM | POA: Diagnosis not present

## 2021-01-23 DIAGNOSIS — I471 Supraventricular tachycardia: Secondary | ICD-10-CM
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
## 2021-01-23 NOTE — Patient Instructions (Addendum)
Medication Instructions:  Your physician recommends that you continue on your current medications as directed. Please refer to the Current Medication list given to you today.  *If you need a refill on your cardiac medications before your next appointment, please call your pharmacy*   Lab Work: We have requested your most recent lab work from your primary care provider If you have labs (blood work) drawn today and your tests are completely normal, you will receive your results only by: MyChart Message (if you have MyChart) OR A paper copy in the mail If you have any lab test that is abnormal or we need to change your treatment, we will call you to review the results.   Testing/Procedures: None   Follow-Up: At [facility], you and your health needs are our priority.  As part of our continuing mission to provide you with exceptional heart care, we have created designated Provider Care Teams.  These Care Teams include your primary Cardiologist (physician) and Advanced Practice Providers (APPs -  Physician Assistants and Nurse Practitioners) who all work together to provide you with the care you need, when you need it.  We recommend signing up for the patient portal called "MyChart".  Sign up information is provided on this After Visit Summary.  MyChart is used to connect with patients for Virtual Visits (Telemedicine).  Patients are able to view lab/test results, encounter notes, upcoming appointments, etc.  Non-urgent messages can be sent to your provider as well.   To learn more about what you can do with MyChart, go to [email or web address].    Your next appointment:   1 year(s)  The format for your next appointment:   In Person  Provider:   Jonathan Branch, MD   Other Instructions    

## 2021-01-25 ENCOUNTER — Other Ambulatory Visit (HOSPITAL_COMMUNITY): Payer: Self-pay | Admitting: Internal Medicine

## 2021-01-25 ENCOUNTER — Other Ambulatory Visit: Payer: Self-pay | Admitting: Internal Medicine

## 2021-01-25 DIAGNOSIS — I6529 Occlusion and stenosis of unspecified carotid artery: Secondary | ICD-10-CM

## 2021-01-25 IMAGING — US US CAROTID DUPLEX BILAT
1 series · 13 of 24 positions shown · non-contrast
Comparison: None.

CLINICAL DATA: Carotid atherosclerosis

EXAM:
BILATERAL CAROTID DUPLEX ULTRASOUND
TECHNIQUE: Gray scale imaging, color Doppler and duplex ultrasound were
performed of bilateral carotid and vertebral arteries in the neck.

[Series 1: us carotid duplex bilat · 13 of 69 slices shown]
[im 1/69]
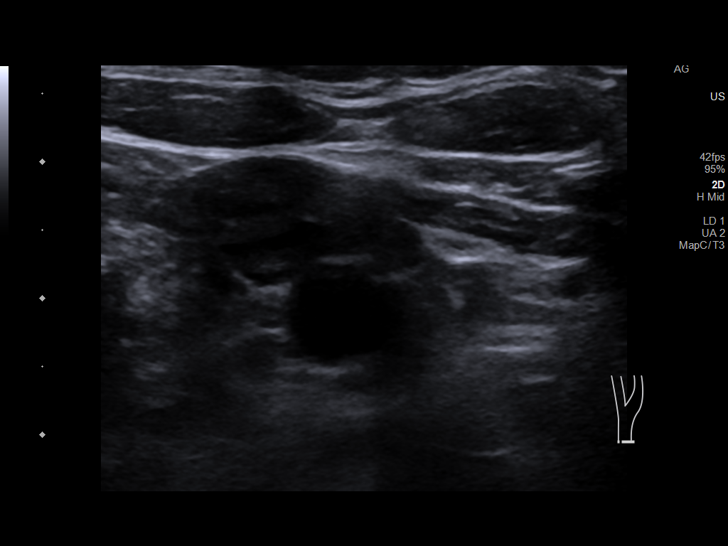
[im 6/69]
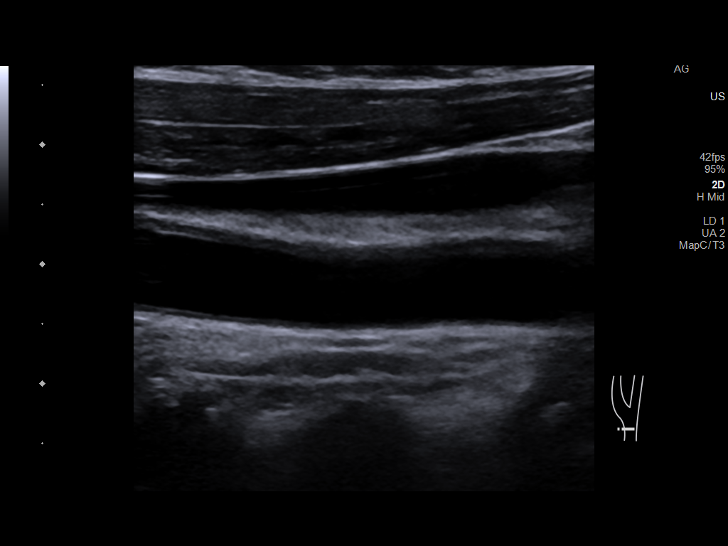
[im 12/69]
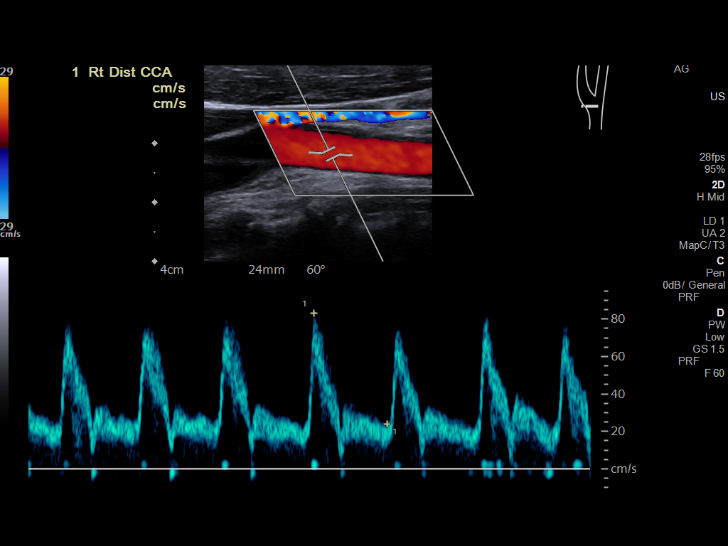
[im 18/69]
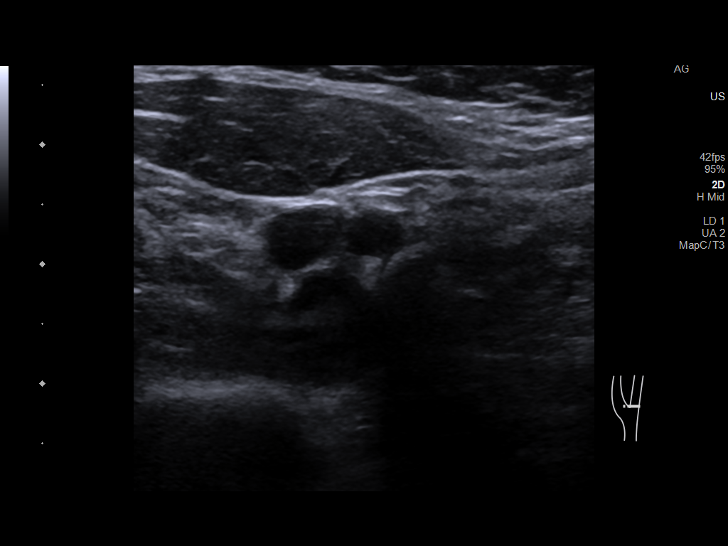
[im 24/69]
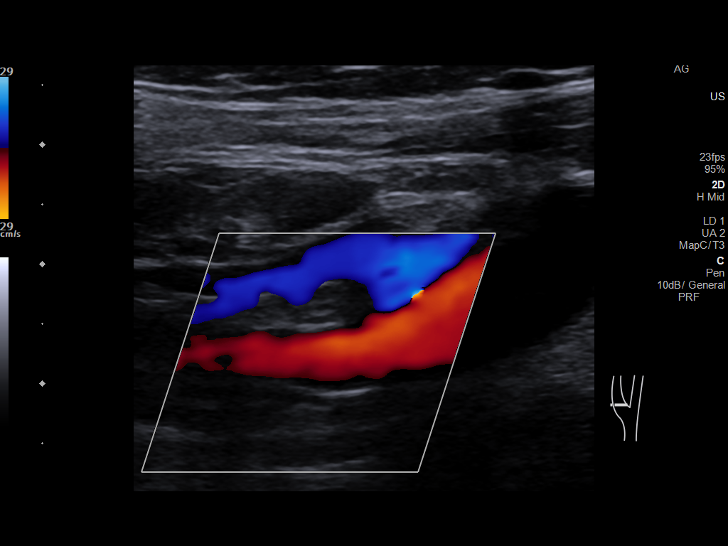
[im 30/69]
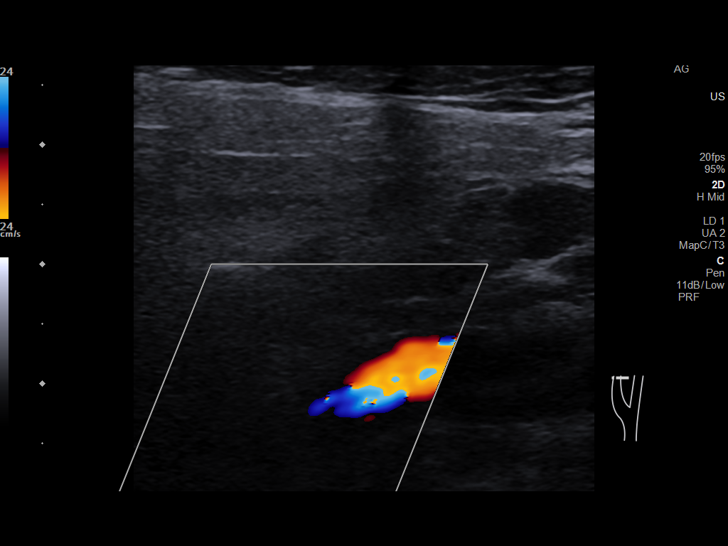
[im 36/69]
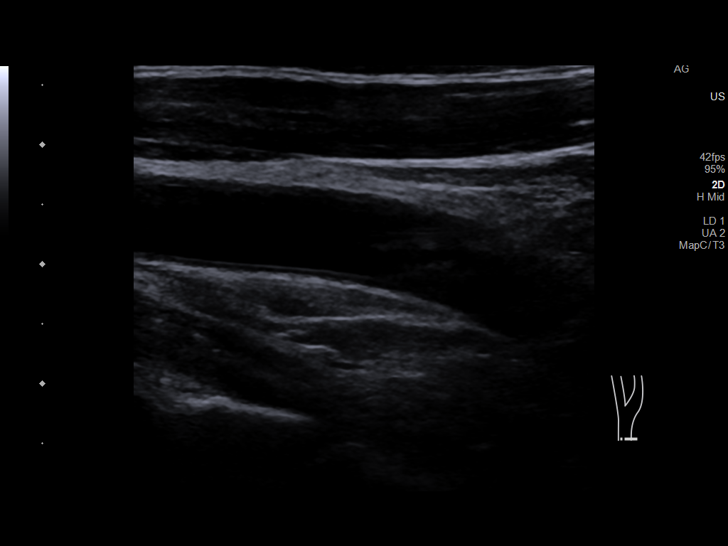
[im 39/69]
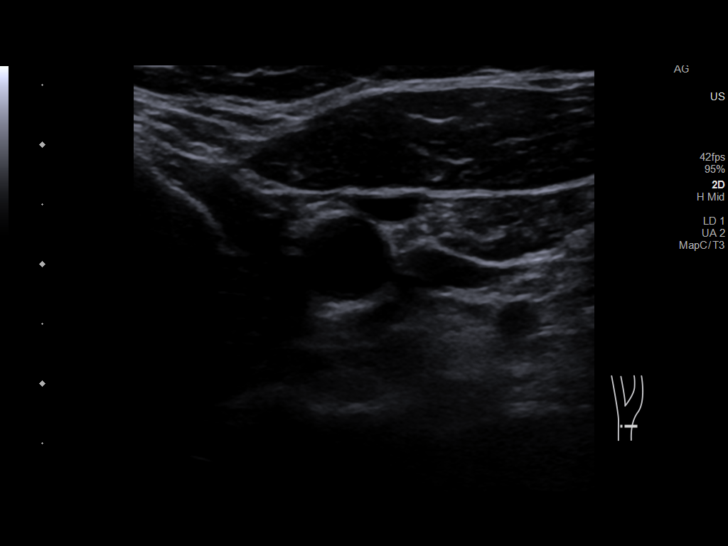
[im 45/69]
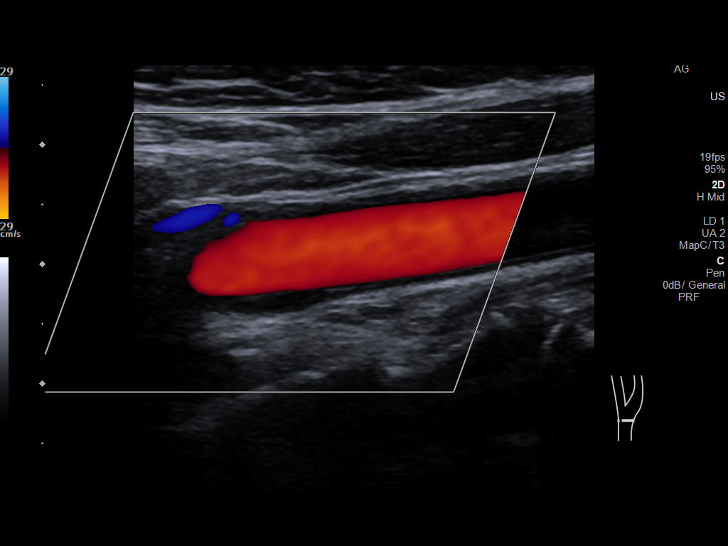
[im 51/69]
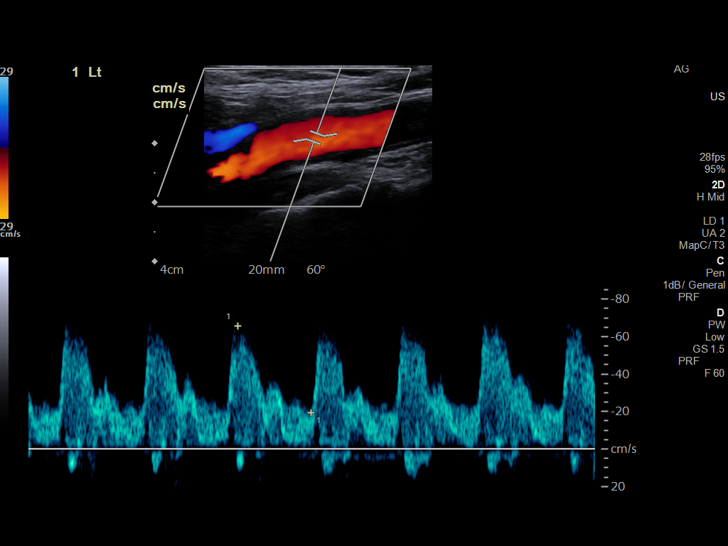
[im 57/69]
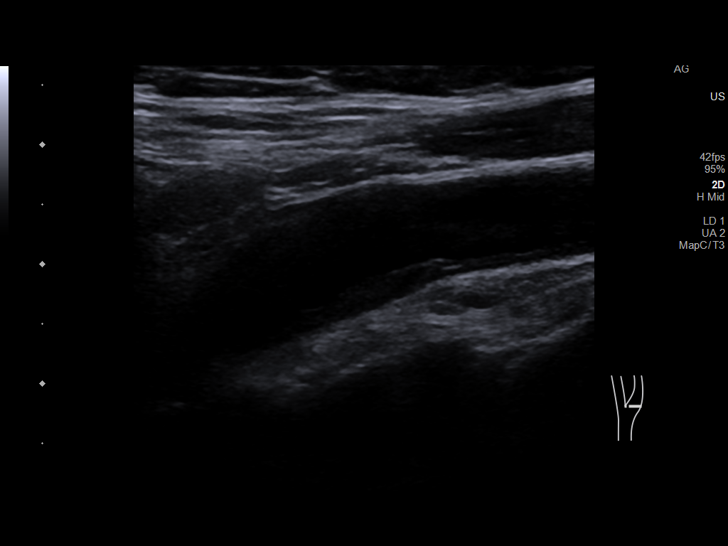
[im 63/69]
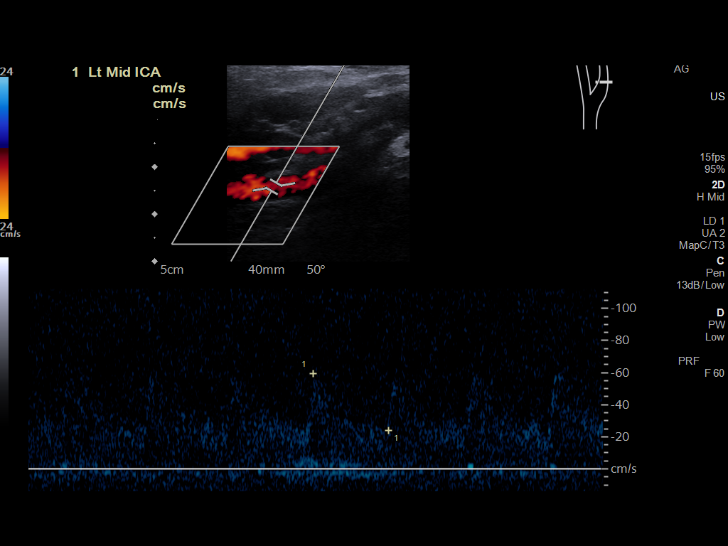
[im 69/69]
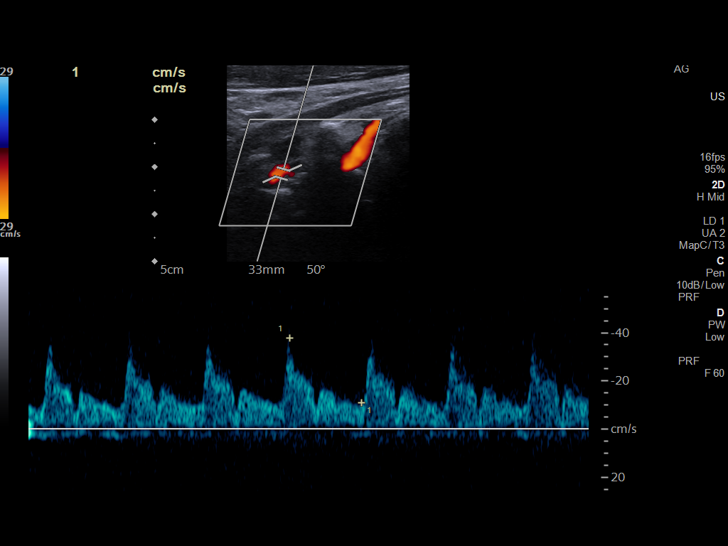

[13 of 24 positions shown; findings below may reference images not displayed]

FINDINGS: Criteria: Quantification of carotid stenosis is based on velocity
parameters that correlate the residual internal carotid diameter
with NASCET-based stenosis levels, using the diameter of the distal
internal carotid lumen as the denominator for stenosis measurement.

The following velocity measurements were obtained:

RIGHT

ICA: 93/42 cm/sec

CCA: 88/25 cm/sec

SYSTOLIC ICA/CCA RATIO:

ECA: 126 cm/sec

LEFT

ICA: 76/30 cm/sec

CCA: 96/30 cm/sec

SYSTOLIC ICA/CCA RATIO:

ECA: 125 cm/sec

RIGHT CAROTID ARTERY: Minor echogenic shadowing plaque formation. No
hemodynamically significant right ICA stenosis, velocity elevation,
or turbulent flow. Degree of narrowing less than 50%.

RIGHT VERTEBRAL ARTERY:  Antegrade

LEFT CAROTID ARTERY: Similar scattered minor echogenic plaque
formation. No hemodynamically significant left ICA stenosis,
velocity elevation, or turbulent flow.

LEFT VERTEBRAL ARTERY:  Antegrade
IMPRESSION: Minor carotid atherosclerosis. No hemodynamically significant ICA
stenosis. Degree of narrowing less than 50% bilaterally by
ultrasound criteria.

Patent antegrade vertebral flow bilaterally

## 2021-02-04 ENCOUNTER — Other Ambulatory Visit: Payer: Self-pay

## 2021-02-04 ENCOUNTER — Ambulatory Visit (HOSPITAL_COMMUNITY)
Admission: RE | Admit: 2021-02-04 | Discharge: 2021-02-04 | Disposition: A | Discharge status: Home / Self Care | Payer: BC Managed Care – PPO | Source: Ambulatory Visit | Attending: Internal Medicine | Admitting: Internal Medicine

## 2021-02-04 DIAGNOSIS — I6529 Occlusion and stenosis of unspecified carotid artery: Secondary | ICD-10-CM | POA: Insufficient documentation

## 2021-04-10 ENCOUNTER — Ambulatory Visit
Admission: EM | Admit: 2021-04-10 | Discharge: 2021-04-10 | Disposition: A | Discharge status: Home / Self Care | Payer: BC Managed Care – PPO | Attending: Family Medicine | Admitting: Family Medicine

## 2021-04-10 ENCOUNTER — Other Ambulatory Visit: Payer: Self-pay

## 2021-04-10 DIAGNOSIS — H9203 Otalgia, bilateral: Secondary | ICD-10-CM | POA: Diagnosis not present

## 2021-04-10 MED ORDER — PREDNISONE 20 MG PO TABS
40.0000 mg | ORAL_TABLET | Freq: Every day | ORAL | 0 refills | Status: DC
Start: 2021-04-10 — End: 2021-11-28

## 2021-04-10 MED ORDER — NEOMYCIN-POLYMYXIN-HC 3.5-10000-1 OT SUSP: 4.0000 [drp] | Freq: Three times a day (TID) | OTIC | 0 refills | Status: DC

## 2021-04-10 MED ORDER — SCOPOLAMINE 1 MG/3DAYS TD PT72: 1.0000 | MEDICATED_PATCH | TRANSDERMAL | 0 refills | Status: DC

## 2021-04-10 MED ORDER — ONDANSETRON 4 MG PO TBDP: 4.0000 mg | ORAL_TABLET | Freq: Three times a day (TID) | ORAL | 0 refills | Status: DC | PRN
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
## 2021-04-10 NOTE — ED Triage Notes (Signed)
Patient states she has ear ache in both ears but mostly in the left, with dizziness and congestion. Left ear is draining.  She thinks she just has an ear infection.   Denies Fever

## 2021-07-05 ENCOUNTER — Other Ambulatory Visit: Payer: Self-pay | Admitting: Cardiology

## 2021-07-25 ENCOUNTER — Encounter: Payer: Self-pay | Admitting: *Deleted

## 2021-09-30 ENCOUNTER — Other Ambulatory Visit: Payer: Self-pay

## 2021-09-30 MED ORDER — METOPROLOL TARTRATE 25 MG PO TABS: ORAL_TABLET | ORAL | 1 refills | Status: DC

## 2021-09-30 NOTE — Telephone Encounter (Signed)
Refilled lopressor 25 mg, 1 1/2 tablets tid to [id number], RF:1 ?

## 2021-11-21 ENCOUNTER — Other Ambulatory Visit (HOSPITAL_COMMUNITY): Payer: Self-pay | Admitting: Internal Medicine

## 2021-11-21 DIAGNOSIS — Z1231 Encounter for screening mammogram for malignant neoplasm of breast: Secondary | ICD-10-CM

## 2021-11-28 ENCOUNTER — Ambulatory Visit: Payer: BC Managed Care – PPO | Admitting: Obstetrics & Gynecology

## 2021-11-28 ENCOUNTER — Encounter: Payer: Self-pay | Admitting: Obstetrics & Gynecology

## 2021-11-28 VITALS — BP 116/74 | HR 79 | Ht 67.0 in | Wt 211.0 lb

## 2021-11-28 DIAGNOSIS — Z01419 Encounter for gynecological examination (general) (routine) without abnormal findings: Secondary | ICD-10-CM

## 2021-11-28 DIAGNOSIS — Z1212 Encounter for screening for malignant neoplasm of rectum: Secondary | ICD-10-CM | POA: Diagnosis not present

## 2021-11-28 DIAGNOSIS — Z1231 Encounter for screening mammogram for malignant neoplasm of breast: Secondary | ICD-10-CM | POA: Diagnosis not present

## 2021-11-28 DIAGNOSIS — Z1211 Encounter for screening for malignant neoplasm of colon: Secondary | ICD-10-CM
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
## 2021-12-19 ENCOUNTER — Ambulatory Visit (HOSPITAL_COMMUNITY)
Admission: RE | Admit: 2021-12-19 | Discharge: 2021-12-19 | Disposition: A | Discharge status: Home / Self Care | Payer: BC Managed Care – PPO | Source: Ambulatory Visit | Attending: Internal Medicine | Admitting: Internal Medicine

## 2021-12-19 DIAGNOSIS — Z1231 Encounter for screening mammogram for malignant neoplasm of breast: Secondary | ICD-10-CM | POA: Diagnosis present

## 2022-03-25 ENCOUNTER — Other Ambulatory Visit (HOSPITAL_COMMUNITY): Payer: Self-pay | Admitting: Family Medicine

## 2022-03-25 ENCOUNTER — Ambulatory Visit (HOSPITAL_COMMUNITY)
Admission: RE | Admit: 2022-03-25 | Discharge: 2022-03-25 | Disposition: A | Discharge status: Home / Self Care | Payer: BC Managed Care – PPO | Source: Ambulatory Visit | Attending: Family Medicine | Admitting: Family Medicine

## 2022-03-25 DIAGNOSIS — M545 Low back pain, unspecified: Secondary | ICD-10-CM | POA: Insufficient documentation

## 2022-05-09 ENCOUNTER — Encounter: Payer: Self-pay | Admitting: Nurse Practitioner

## 2022-05-09 ENCOUNTER — Encounter: Payer: Self-pay | Admitting: *Deleted

## 2022-05-09 ENCOUNTER — Ambulatory Visit: Payer: BC Managed Care – PPO | Attending: Nurse Practitioner | Admitting: Nurse Practitioner

## 2022-05-09 VITALS — BP 138/86 | HR 90 | Ht 67.0 in | Wt 214.4 lb

## 2022-05-09 DIAGNOSIS — R0789 Other chest pain: Secondary | ICD-10-CM

## 2022-05-09 DIAGNOSIS — Z136 Encounter for screening for cardiovascular disorders: Secondary | ICD-10-CM | POA: Diagnosis not present

## 2022-05-09 DIAGNOSIS — R002 Palpitations: Secondary | ICD-10-CM | POA: Diagnosis not present

## 2022-05-09 DIAGNOSIS — E785 Hyperlipidemia, unspecified: Secondary | ICD-10-CM

## 2022-05-09 DIAGNOSIS — E039 Hypothyroidism, unspecified: Secondary | ICD-10-CM

## 2022-05-09 DIAGNOSIS — E669 Obesity, unspecified: Secondary | ICD-10-CM

## 2022-05-09 DIAGNOSIS — Z01812 Encounter for preprocedural laboratory examination: Secondary | ICD-10-CM | POA: Diagnosis not present

## 2022-05-09 DIAGNOSIS — I471 Supraventricular tachycardia, unspecified: Secondary | ICD-10-CM

## 2022-05-09 MED ORDER — METOPROLOL TARTRATE 50 MG PO TABS: 50.0000 mg | ORAL_TABLET | Freq: Every day | ORAL | 6 refills | Status: DC

## 2022-05-09 MED ORDER — METOPROLOL TARTRATE 37.5 MG PO TABS: 37.5000 mg | ORAL_TABLET | Freq: Three times a day (TID) | ORAL | 6 refills | Status: DC
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
## 2022-05-09 NOTE — Patient Instructions (Addendum)
Medication Instructions:  Add noon dose of 2 tabs of the Lopressor  Continue all other medications.     Labwork: BMET - order given today  Please do just prior to CT  Testing/Procedures: Coronary CTA  Office will contact with results via phone, letter or mychart.     Follow-Up: 8 weeks  Any Other Special Instructions Will Be Listed Below (If Applicable). Salty six & Mediterranean diet info given today   If you need a refill on your cardiac medications before your next appointment, please call your pharmacy.

## 2022-05-10 ENCOUNTER — Encounter: Payer: Self-pay | Admitting: Nurse Practitioner

## 2022-05-16 ENCOUNTER — Other Ambulatory Visit (HOSPITAL_COMMUNITY)
Admission: RE | Admit: 2022-05-16 | Discharge: 2022-05-16 | Disposition: A | Discharge status: Home / Self Care | Payer: BC Managed Care – PPO | Source: Ambulatory Visit | Attending: Nurse Practitioner | Admitting: Nurse Practitioner

## 2022-05-16 ENCOUNTER — Telehealth (HOSPITAL_COMMUNITY): Payer: Self-pay | Admitting: *Deleted

## 2022-05-16 DIAGNOSIS — Z01812 Encounter for preprocedural laboratory examination: Secondary | ICD-10-CM | POA: Insufficient documentation

## 2022-05-16 DIAGNOSIS — R0789 Other chest pain: Secondary | ICD-10-CM | POA: Diagnosis present

## 2022-05-16 LAB — BASIC METABOLIC PANEL
Anion gap: 4 — ABNORMAL LOW (ref 5–15)
BUN: 16 mg/dL (ref 6–20)
CO2: 23 mmol/L (ref 22–32)
Calcium: 9 mg/dL (ref 8.9–10.3)
Chloride: 111 mmol/L (ref 98–111)
Creatinine, Ser: 0.81 mg/dL (ref 0.44–1.00)
GFR, Estimated: >60 mL/min (ref >60)
Glucose, Bld: 112 mg/dL — ABNORMAL HIGH (ref 70–99)
Potassium: 4.4 mmol/L (ref 3.5–5.1)
Sodium: 138 mmol/L (ref 135–145)

## 2022-05-16 NOTE — Telephone Encounter (Signed)
Reaching out to patient to offer assistance regarding upcoming cardiac imaging study; pt verbalizes understanding of appt date/time, parking situation and where to check in, pre-test NPO status and medications ordered, and verified current allergies; name and call back number provided for further questions should they arise  Howard Patton RN Navigator Cardiac Imaging [facility] [phone] office [phone] cell  Patient to take 100mg metoprolol tartrate two hours prior to her cardiac CT scan. She is aware to arrive at 11:30 am. 

## 2022-05-20 ENCOUNTER — Encounter (HOSPITAL_COMMUNITY): Payer: Self-pay

## 2022-05-20 ENCOUNTER — Ambulatory Visit (HOSPITAL_COMMUNITY)
Admission: RE | Admit: 2022-05-20 | Discharge: 2022-05-20 | Disposition: A | Discharge status: Home / Self Care | Payer: BC Managed Care – PPO | Source: Ambulatory Visit | Attending: Nurse Practitioner | Admitting: Nurse Practitioner

## 2022-05-20 DIAGNOSIS — R0789 Other chest pain: Secondary | ICD-10-CM

## 2022-05-20 MED ORDER — IOHEXOL 350 MG/ML SOLN
95.0000 mL | Freq: Once | INTRAVENOUS | Status: AC | PRN
  Administered 2022-05-20: 95 mL via INTRAVENOUS

## 2022-05-20 MED ORDER — NITROGLYCERIN 0.4 MG SL SUBL
SUBLINGUAL_TABLET | SUBLINGUAL | Status: AC
  Filled 2022-05-20: qty 2

## 2022-05-20 MED ORDER — METOPROLOL TARTRATE 5 MG/5ML IV SOLN
INTRAVENOUS | Status: AC
  Filled 2022-05-20: qty 5

## 2022-05-20 MED ORDER — NITROGLYCERIN 0.4 MG SL SUBL: 0.8000 mg | SUBLINGUAL_TABLET | Freq: Once | SUBLINGUAL | Status: DC

## 2022-05-20 NOTE — Progress Notes (Signed)
Pt tolerated exam without incident; pt denies lightheadedness or dizziness; pt ambulatory to lobby steady gait noted  

## 2022-06-02 ENCOUNTER — Encounter: Payer: Self-pay | Admitting: *Deleted

## 2022-06-09 ENCOUNTER — Other Ambulatory Visit: Payer: Self-pay | Admitting: *Deleted

## 2022-06-09 DIAGNOSIS — Z79899 Other long term (current) drug therapy: Secondary | ICD-10-CM

## 2022-06-09 DIAGNOSIS — E785 Hyperlipidemia, unspecified: Secondary | ICD-10-CM

## 2022-06-09 MED ORDER — ROSUVASTATIN CALCIUM 10 MG PO TABS: 10.0000 mg | ORAL_TABLET | Freq: Every day | ORAL | 6 refills | Status: DC

## 2022-06-10 ENCOUNTER — Other Ambulatory Visit: Payer: Self-pay | Admitting: *Deleted

## 2022-06-10 MED ORDER — ROSUVASTATIN CALCIUM 20 MG PO TABS: 20.0000 mg | ORAL_TABLET | Freq: Every day | ORAL | 6 refills | Status: DC

## 2022-06-25 ENCOUNTER — Other Ambulatory Visit (HOSPITAL_COMMUNITY)
Admission: RE | Admit: 2022-06-25 | Discharge: 2022-06-25 | Disposition: A | Discharge status: Home / Self Care | Payer: BC Managed Care – PPO | Source: Ambulatory Visit | Attending: Nurse Practitioner | Admitting: Nurse Practitioner

## 2022-06-25 DIAGNOSIS — E785 Hyperlipidemia, unspecified: Secondary | ICD-10-CM | POA: Diagnosis present

## 2022-06-25 DIAGNOSIS — Z79899 Other long term (current) drug therapy: Secondary | ICD-10-CM

## 2022-06-25 LAB — LIPID PANEL
Cholesterol: 127 mg/dL (ref 0–200)
HDL: 36 mg/dL — ABNORMAL LOW (ref >40)
LDL Cholesterol: 72 mg/dL (ref 0–99)
Total CHOL/HDL Ratio: 3.5 RATIO
Triglycerides: 95 mg/dL (ref <150)
VLDL: 19 mg/dL (ref 0–40)

## 2022-06-25 LAB — HEPATIC FUNCTION PANEL
ALT: 18 U/L (ref 0–44)
AST: 21 U/L (ref 15–41)
Albumin: 4.3 g/dL (ref 3.5–5.0)
Alkaline Phosphatase: 57 U/L (ref 38–126)
Bilirubin, Direct: 0.1 mg/dL (ref 0.0–0.2)
Indirect Bilirubin: 0.5 mg/dL (ref 0.3–0.9)
Total Bilirubin: 0.6 mg/dL (ref 0.3–1.2)
Total Protein: 7.6 g/dL (ref 6.5–8.1)

## 2022-07-01 ENCOUNTER — Encounter: Payer: Self-pay | Admitting: Nurse Practitioner

## 2022-07-01 ENCOUNTER — Ambulatory Visit: Payer: BC Managed Care – PPO | Attending: Nurse Practitioner | Admitting: Nurse Practitioner

## 2022-07-01 VITALS — BP 130/83 | HR 88 | Ht 67.0 in | Wt 214.0 lb

## 2022-07-01 DIAGNOSIS — I471 Supraventricular tachycardia, unspecified: Secondary | ICD-10-CM | POA: Diagnosis not present

## 2022-07-01 DIAGNOSIS — R002 Palpitations: Secondary | ICD-10-CM

## 2022-07-01 DIAGNOSIS — R030 Elevated blood-pressure reading, without diagnosis of hypertension: Secondary | ICD-10-CM

## 2022-07-01 DIAGNOSIS — E669 Obesity, unspecified: Secondary | ICD-10-CM

## 2022-07-01 DIAGNOSIS — E039 Hypothyroidism, unspecified: Secondary | ICD-10-CM | POA: Diagnosis not present

## 2022-07-01 DIAGNOSIS — I2510 Atherosclerotic heart disease of native coronary artery without angina pectoris: Secondary | ICD-10-CM

## 2022-07-01 DIAGNOSIS — E785 Hyperlipidemia, unspecified: Secondary | ICD-10-CM

## 2022-07-01 MED ORDER — METOPROLOL TARTRATE 50 MG PO TABS: 50.0000 mg | ORAL_TABLET | Freq: Every day | ORAL | Status: DC

## 2022-07-01 MED ORDER — ROSUVASTATIN CALCIUM 10 MG PO TABS: 10.0000 mg | ORAL_TABLET | Freq: Every day | ORAL | 3 refills | Status: AC

## 2022-07-01 MED ORDER — METOPROLOL TARTRATE 37.5 MG PO TABS: 37.5000 mg | ORAL_TABLET | Freq: Two times a day (BID) | ORAL | Status: DC

## 2022-07-01 NOTE — Patient Instructions (Signed)
Medication Instructions:  Decrease Crestor back to 77m daily  Continue all other medications.     Labwork: none  Testing/Procedures: none  Follow-Up: Your physician wants you to follow up in:  1 year.  You should receive a call from the office when due.  If you don't receive this call, please call our office to schedule the follow up appointment    Any Other Special Instructions Will Be Listed Below (If Applicable).   If you need a refill on your cardiac medications before your next appointment, please call your pharmacy.
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
## 2022-07-02 ENCOUNTER — Encounter: Payer: Self-pay | Admitting: Nurse Practitioner

## 2022-08-07 IMAGING — MG MM DIGITAL SCREENING BILAT W/ TOMO AND CAD
8 series · 8 of 24 positions shown · non-contrast
Comparison: Previous exam(s).

CLINICAL DATA: Screening.

EXAM:
DIGITAL SCREENING BILATERAL MAMMOGRAM WITH TOMOSYNTHESIS AND CAD
TECHNIQUE: Bilateral screening digital craniocaudal and mediolateral oblique
mammograms were obtained. Bilateral screening digital breast
tomosynthesis was performed. The images were evaluated with
computer-aided detection.

[R CC synth-2D]
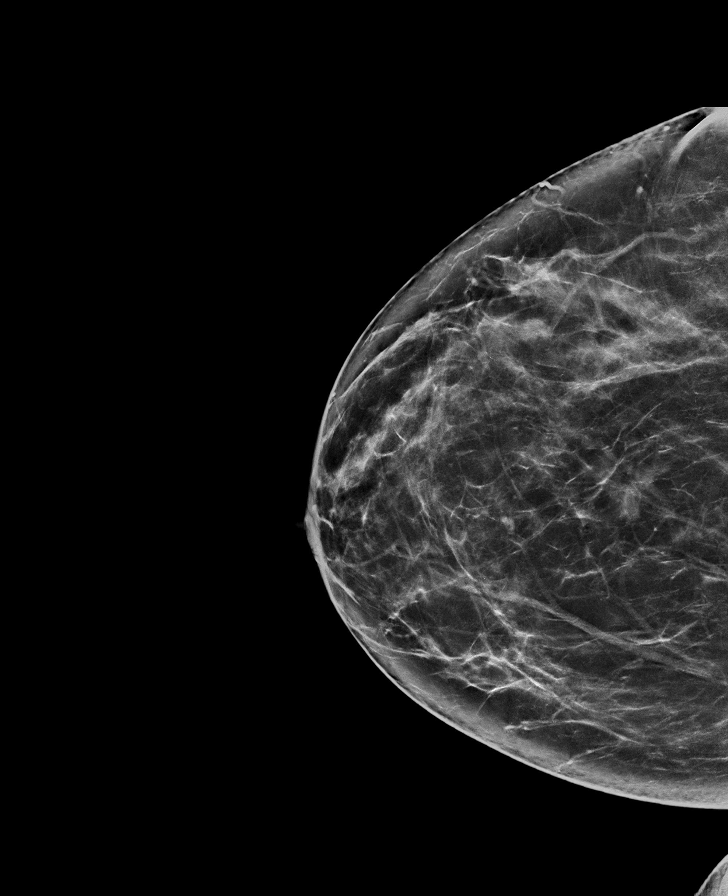

[L MLO synth-2D]
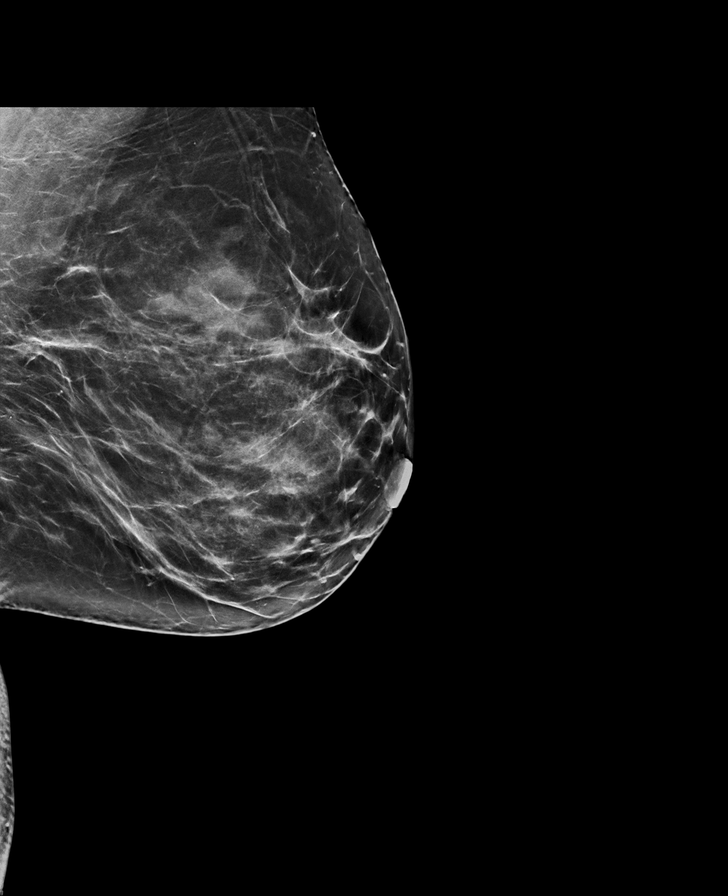

[L CC synth-2D]
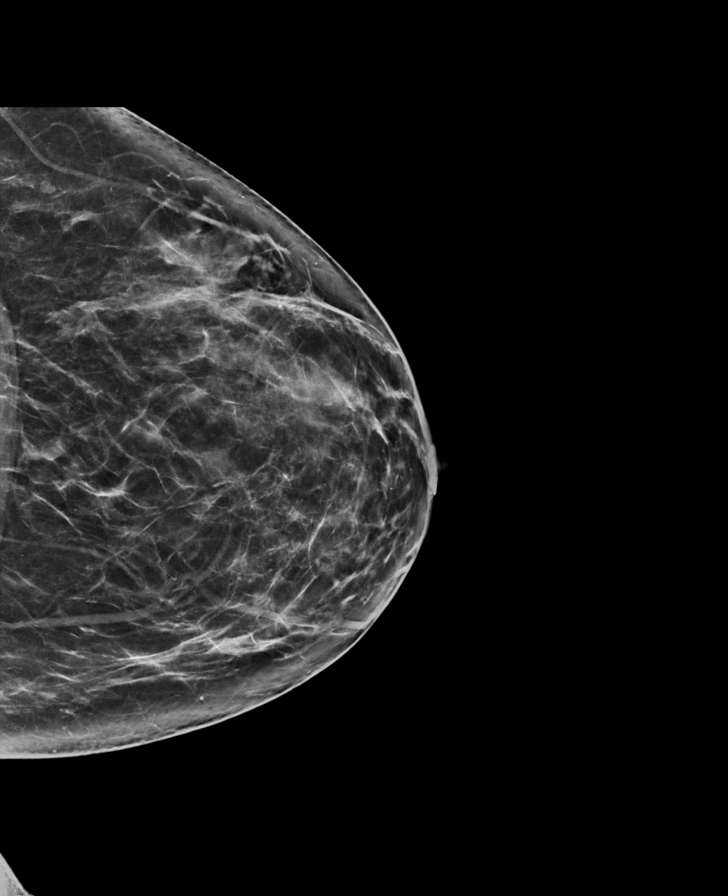

[R MLO synth-2D]
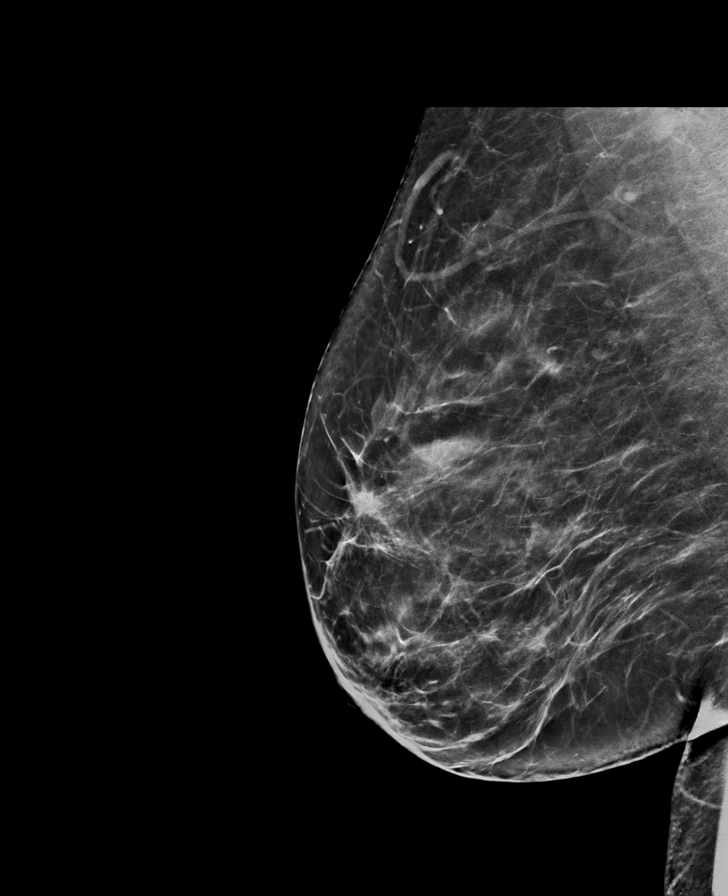

[R MLO tomo · tomo slice 41/82.0]
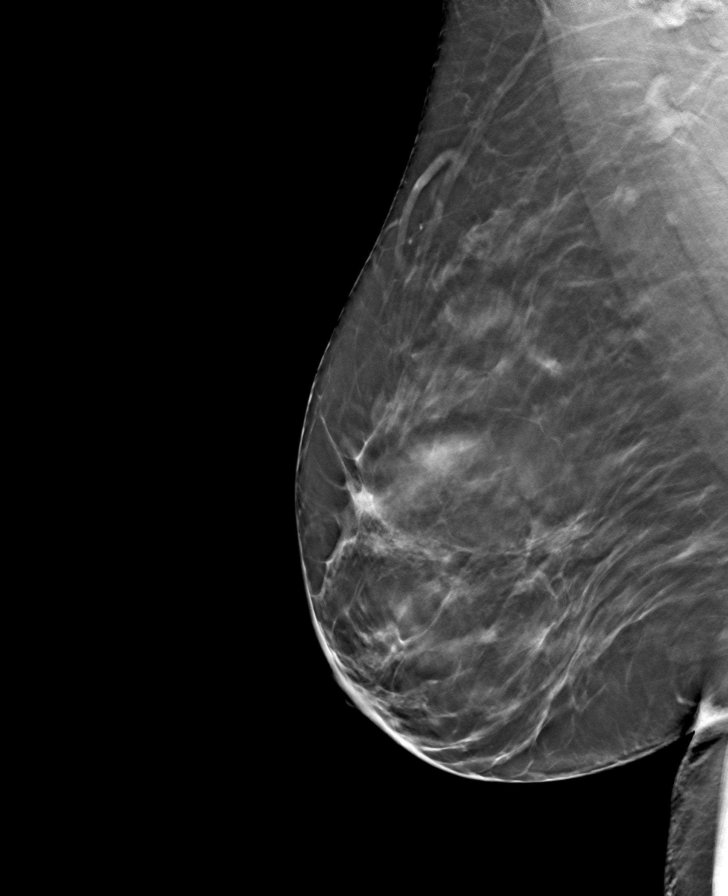

[L MLO tomo · tomo slice 41/80.0]
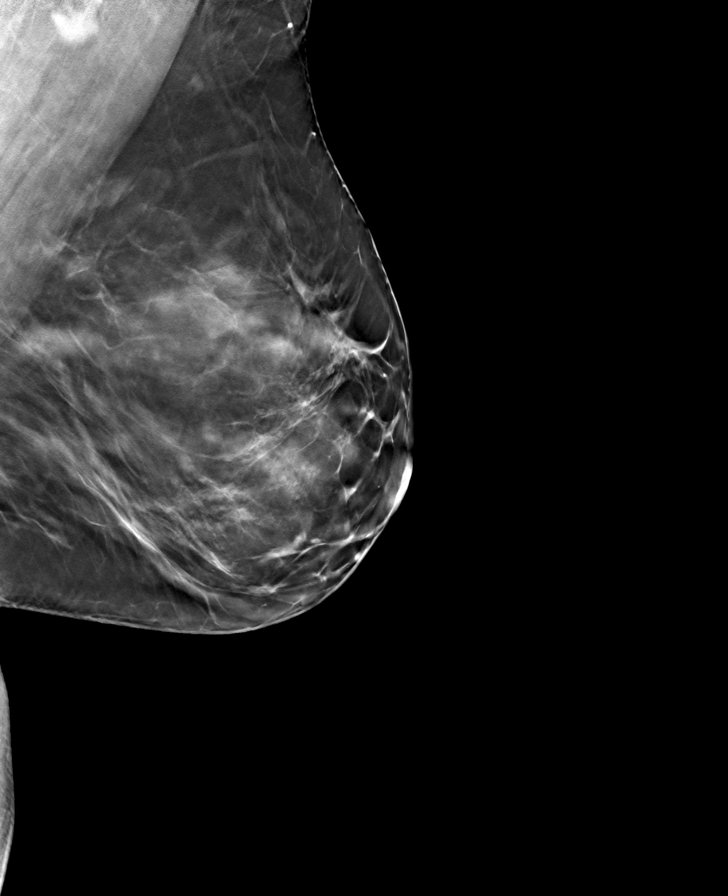

[R CC tomo · tomo slice 39/78.0]
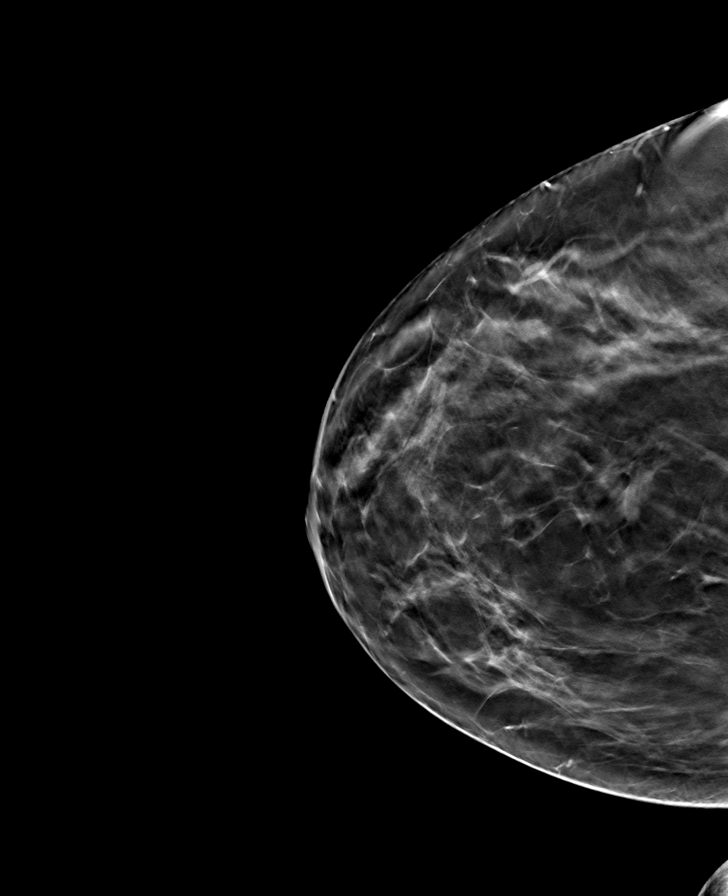

[L CC tomo · tomo slice 38/75.0]
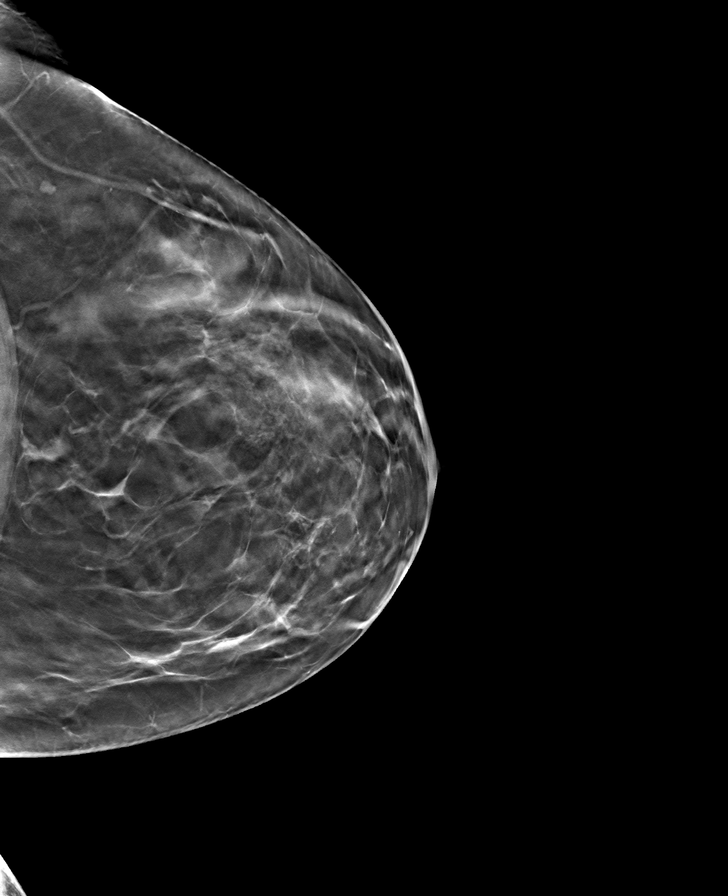

[8 of 24 positions shown; findings below may reference images not displayed]

ACR Breast Density Category c: The breast tissue is heterogeneously
dense, which may obscure small masses.
FINDINGS: There are no findings suspicious for malignancy.
IMPRESSION: No mammographic evidence of malignancy. A result letter of this
screening mammogram will be mailed directly to the patient.

RECOMMENDATION:
Screening mammogram in one year. (Code:Q3-W-BC3)

BI-RADS CATEGORY  1: Negative.

## 2022-09-25 IMAGING — US US CAROTID DUPLEX BILAT
1 series · 13 of 24 positions shown · non-contrast
Comparison: 06/07/2019

CLINICAL DATA: Carotid atherosclerosis

EXAM:
BILATERAL CAROTID DUPLEX ULTRASOUND
TECHNIQUE: Gray scale imaging, color Doppler and duplex ultrasound were
performed of bilateral carotid and vertebral arteries in the neck.

[Series 1: us carotid bilateral · 13 of 80 slices shown]
[im 1/80]
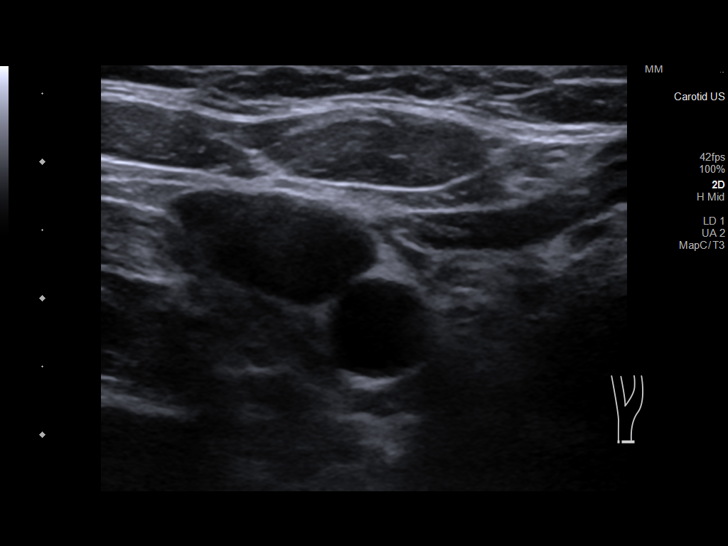
[im 7/80]
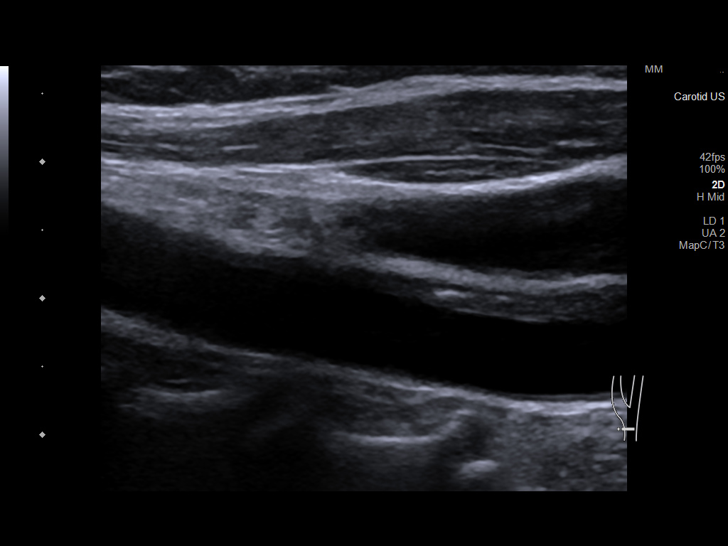
[im 14/80]
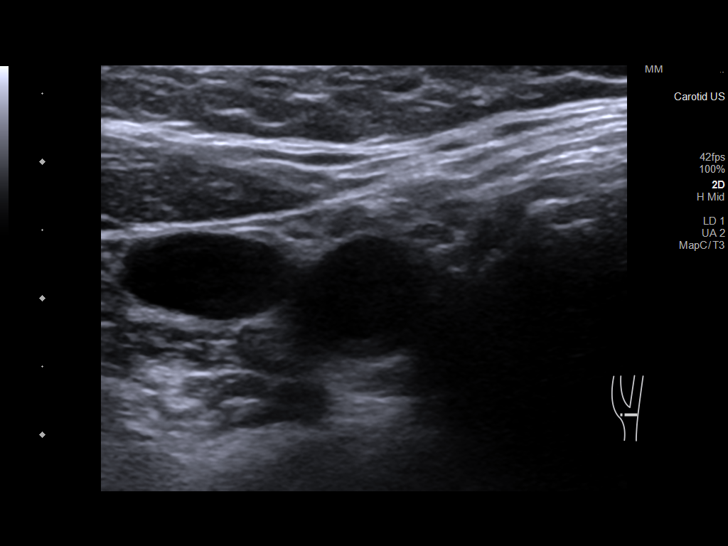
[im 21/80]
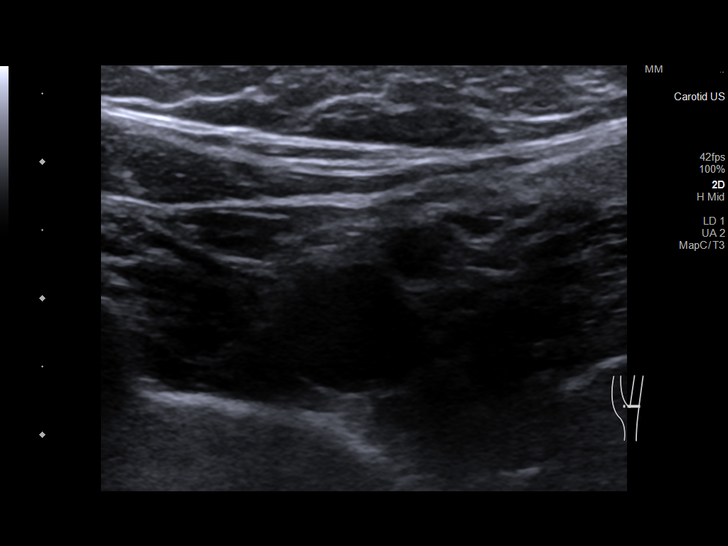
[im 28/80]
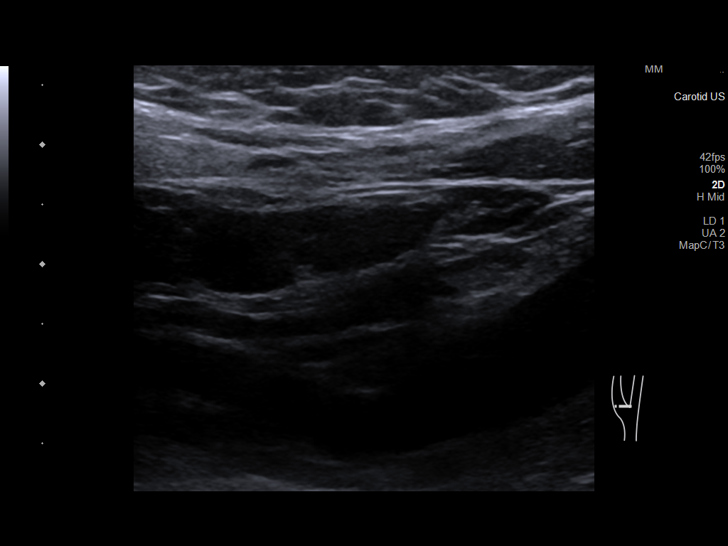
[im 35/80]
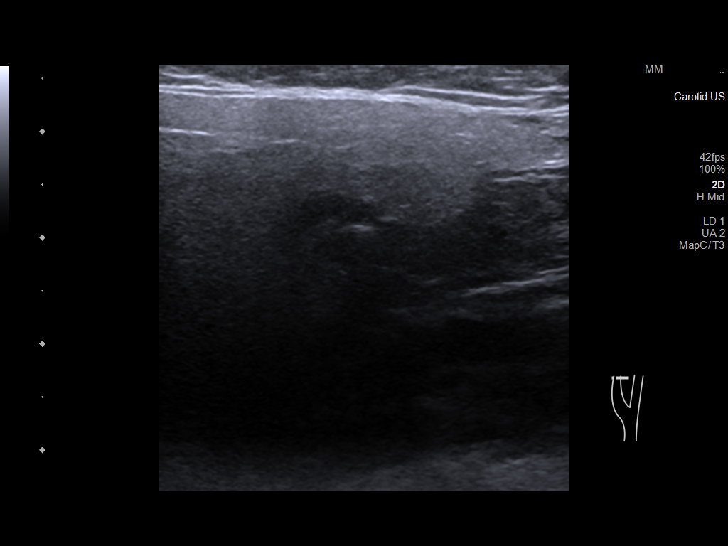
[im 42/80]
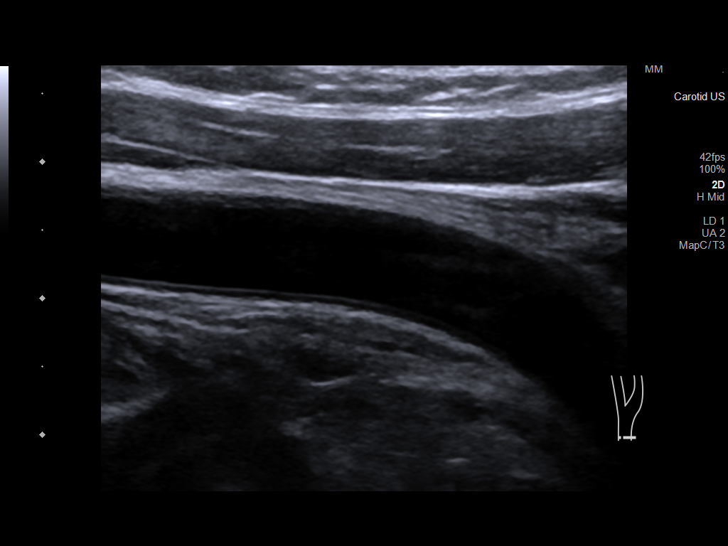
[im 45/80]
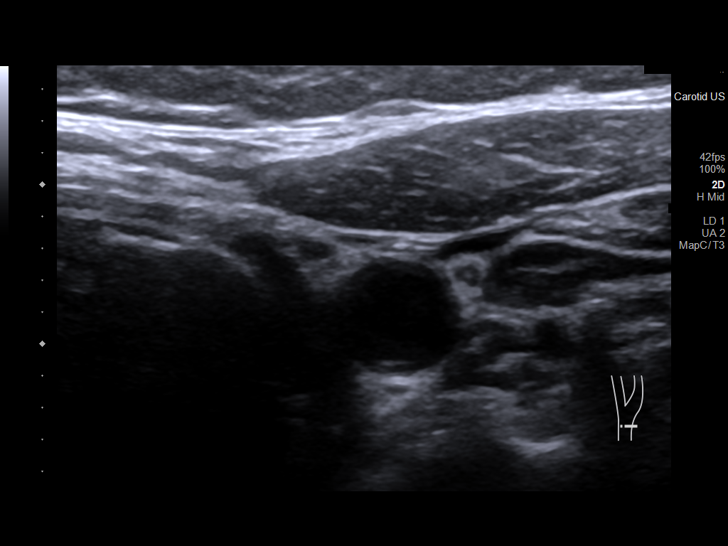
[im 52/80]
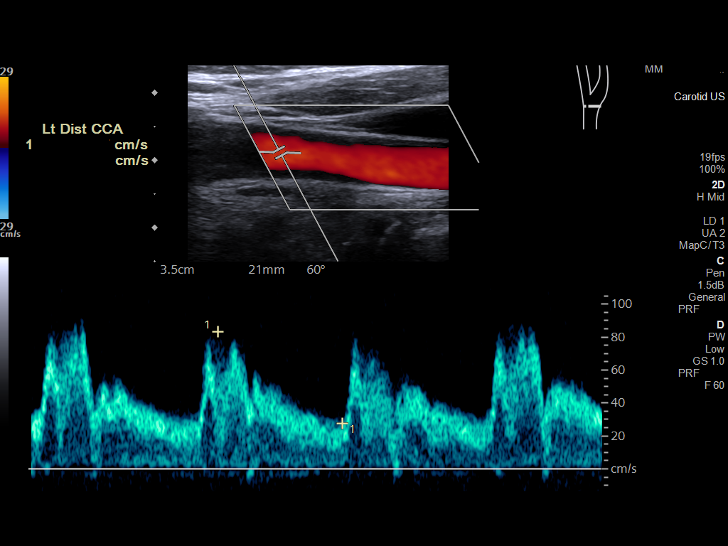
[im 59/80]
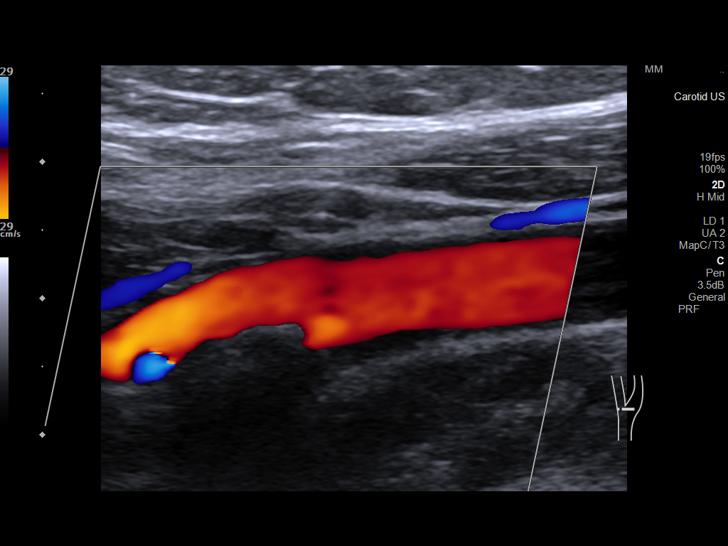
[im 66/80]
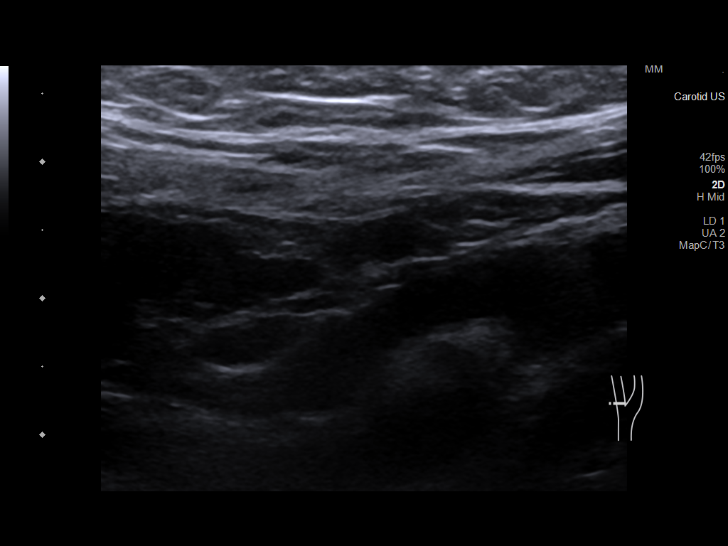
[im 73/80]
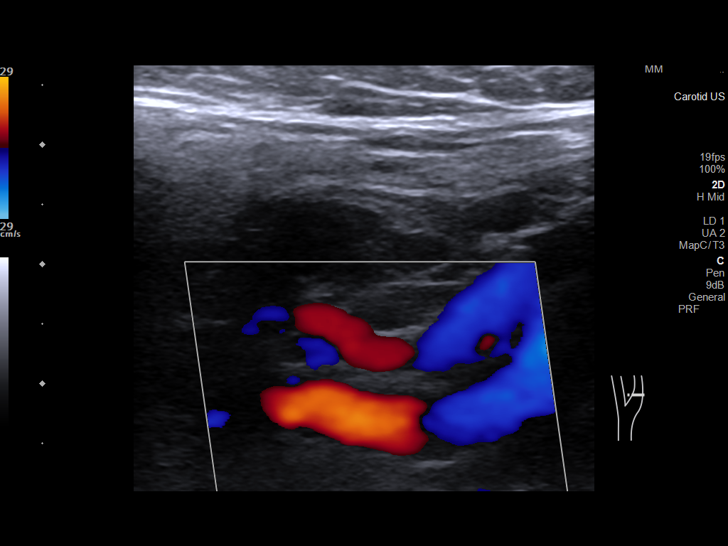
[im 80/80]
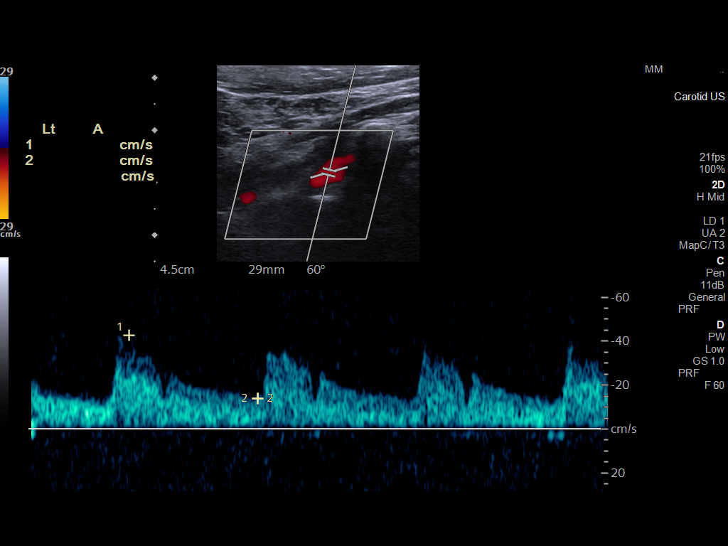

[13 of 24 positions shown; findings below may reference images not displayed]

FINDINGS: Criteria: Quantification of carotid stenosis is based on velocity
parameters that correlate the residual internal carotid diameter
with NASCET-based stenosis levels, using the diameter of the distal
internal carotid lumen as the denominator for stenosis measurement.

The following velocity measurements were obtained:

RIGHT

ICA: 89/42 cm/sec

CCA: 91/29 cm/sec

SYSTOLIC ICA/CCA RATIO:

ECA: 128 cm/sec

LEFT

ICA: 116/48 cm/sec

CCA: 82/30 cm/sec

SYSTOLIC ICA/CCA RATIO:

ECA: 124 cm/sec

RIGHT CAROTID ARTERY: Similar intimal thickening and minor
hypoechoic plaque formation. Negative for stenosis, velocity
elevation, turbulent flow. Degree of narrowing less than 50%.

RIGHT VERTEBRAL ARTERY:  Normal antegrade flow

LEFT CAROTID ARTERY: Similar intimal thickening and hypoechoic
bifurcation atherosclerosis. Negative for stenosis, velocity
elevation, turbulent flow. Degree of narrowing also less than 50% by
ultrasound criteria.

LEFT VERTEBRAL ARTERY:  Normal antegrade flow
IMPRESSION: Minor carotid atherosclerosis. Negative for significant stenosis.
Degree of narrowing less than 50% bilaterally by ultrasound
criteria.

Patent antegrade vertebral flow bilaterally

## 2022-11-05 ENCOUNTER — Other Ambulatory Visit (HOSPITAL_COMMUNITY): Payer: Self-pay | Admitting: Internal Medicine

## 2022-11-05 DIAGNOSIS — Z1231 Encounter for screening mammogram for malignant neoplasm of breast: Secondary | ICD-10-CM

## 2022-12-18 ENCOUNTER — Other Ambulatory Visit: Payer: Self-pay | Admitting: Nurse Practitioner

## 2022-12-22 ENCOUNTER — Ambulatory Visit (HOSPITAL_COMMUNITY)
Admission: RE | Admit: 2022-12-22 | Discharge: 2022-12-22 | Disposition: A | Discharge status: Home / Self Care | Payer: BC Managed Care – PPO | Source: Ambulatory Visit | Attending: Internal Medicine | Admitting: Internal Medicine

## 2022-12-22 ENCOUNTER — Encounter (HOSPITAL_COMMUNITY): Payer: Self-pay

## 2022-12-22 DIAGNOSIS — Z1231 Encounter for screening mammogram for malignant neoplasm of breast: Secondary | ICD-10-CM | POA: Insufficient documentation

## 2022-12-31 ENCOUNTER — Other Ambulatory Visit (HOSPITAL_COMMUNITY): Payer: Self-pay | Admitting: Internal Medicine

## 2022-12-31 DIAGNOSIS — R928 Other abnormal and inconclusive findings on diagnostic imaging of breast: Secondary | ICD-10-CM

## 2023-01-06 ENCOUNTER — Encounter (HOSPITAL_COMMUNITY): Payer: Self-pay

## 2023-01-06 ENCOUNTER — Ambulatory Visit (HOSPITAL_COMMUNITY)
Admission: RE | Admit: 2023-01-06 | Discharge: 2023-01-06 | Disposition: A | Discharge status: Home / Self Care | Payer: BC Managed Care – PPO | Source: Ambulatory Visit | Attending: Internal Medicine | Admitting: Internal Medicine

## 2023-01-06 DIAGNOSIS — R928 Other abnormal and inconclusive findings on diagnostic imaging of breast: Secondary | ICD-10-CM | POA: Insufficient documentation

## 2023-03-12 ENCOUNTER — Other Ambulatory Visit: Payer: Self-pay | Admitting: Nurse Practitioner

## 2023-04-12 ENCOUNTER — Other Ambulatory Visit: Payer: Self-pay | Admitting: Nurse Practitioner

## 2023-05-07 ENCOUNTER — Encounter (INDEPENDENT_AMBULATORY_CARE_PROVIDER_SITE_OTHER): Payer: Self-pay | Admitting: *Deleted

## 2023-05-25 ENCOUNTER — Other Ambulatory Visit: Payer: Self-pay | Admitting: Nurse Practitioner

## 2023-05-25 DIAGNOSIS — E785 Hyperlipidemia, unspecified: Secondary | ICD-10-CM

## 2023-05-25 DIAGNOSIS — I2510 Atherosclerotic heart disease of native coronary artery without angina pectoris: Secondary | ICD-10-CM

## 2023-05-25 DIAGNOSIS — Z79899 Other long term (current) drug therapy: Secondary | ICD-10-CM

## 2023-05-25 MED ORDER — EZETIMIBE 10 MG PO TABS: 10.0000 mg | ORAL_TABLET | Freq: Every day | ORAL | 1 refills | Status: DC

## 2023-08-23 ENCOUNTER — Other Ambulatory Visit: Payer: Self-pay | Admitting: Nurse Practitioner

## 2023-09-15 ENCOUNTER — Encounter: Payer: Self-pay | Admitting: *Deleted

## 2023-09-21 ENCOUNTER — Ambulatory Visit: Payer: Self-pay | Attending: Cardiology | Admitting: Cardiology

## 2023-09-21 ENCOUNTER — Encounter: Payer: Self-pay | Admitting: Cardiology

## 2023-09-21 VITALS — BP 120/77 | HR 71 | Wt 211.8 lb

## 2023-09-21 DIAGNOSIS — I471 Supraventricular tachycardia, unspecified: Secondary | ICD-10-CM

## 2023-09-21 DIAGNOSIS — I2510 Atherosclerotic heart disease of native coronary artery without angina pectoris: Secondary | ICD-10-CM

## 2023-09-21 DIAGNOSIS — R002 Palpitations: Secondary | ICD-10-CM

## 2023-09-21 NOTE — Patient Instructions (Addendum)
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
## 2023-10-27 LAB — LAB REPORT - SCANNED
A1c: 5.8
Albumin, Urine POC: <3
Albumin/Creatinine Ratio, Urine, POC: <4
Creatinine, POC: 76.3 mg/dL
EGFR: 81
Free T4: 1.61 ng/dL
TSH: 0.09 — AB (ref 0.41–5.90)

## 2023-10-28 ENCOUNTER — Ambulatory Visit (INDEPENDENT_AMBULATORY_CARE_PROVIDER_SITE_OTHER): Admitting: Internal Medicine

## 2023-10-28 ENCOUNTER — Encounter: Payer: Self-pay | Admitting: Internal Medicine

## 2023-10-28 ENCOUNTER — Other Ambulatory Visit: Payer: Self-pay | Admitting: *Deleted

## 2023-10-28 ENCOUNTER — Encounter: Payer: Self-pay | Admitting: *Deleted

## 2023-10-28 VITALS — BP 107/67 | HR 80 | Temp 98.7°F | Ht 67.0 in | Wt 215.8 lb

## 2023-10-28 DIAGNOSIS — K5909 Other constipation: Secondary | ICD-10-CM

## 2023-10-28 DIAGNOSIS — Z83719 Family history of colon polyps, unspecified: Secondary | ICD-10-CM | POA: Diagnosis not present

## 2023-10-28 DIAGNOSIS — Z1211 Encounter for screening for malignant neoplasm of colon: Secondary | ICD-10-CM

## 2023-10-28 DIAGNOSIS — K5900 Constipation, unspecified: Secondary | ICD-10-CM

## 2023-10-28 MED ORDER — NA SULFATE-K SULFATE-MG SULF 17.5-3.13-1.6 GM/177ML PO SOLN: 1.0000 | ORAL | 0 refills | Status: DC
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
## 2023-10-28 NOTE — Patient Instructions (Signed)
 We will schedule you for colonoscopy for colon cancer screening purposes.  Continue on senna tea for your constipation.  It was very nice meeting you today.  Dr. Mordechai April

## 2023-11-02 ENCOUNTER — Ambulatory Visit: Payer: Self-pay | Admitting: Cardiology

## 2023-11-15 ENCOUNTER — Other Ambulatory Visit: Payer: Self-pay | Admitting: Nurse Practitioner

## 2023-11-24 ENCOUNTER — Telehealth: Payer: Self-pay | Admitting: Cardiology

## 2023-11-24 MED ORDER — EZETIMIBE 10 MG PO TABS: 10.0000 mg | ORAL_TABLET | Freq: Every day | ORAL | 3 refills | Status: AC

## 2023-11-24 NOTE — Telephone Encounter (Signed)
*  STAT* If patient is at the pharmacy, call can be transferred to refill team.   1. Which medications need to be refilled? (please list name of each medication and dose if known) ezetimibe (ZETIA) 10 MG tablet  2. Which pharmacy/location (including street and city if local pharmacy) is medication to be sent to? [place], [place] [address]  3. Do they need a 30 day or 90 day supply? [place]

## 2023-11-24 NOTE — Telephone Encounter (Signed)
 Pt's medication was sent to pt's pharmacy as requested. Confirmation received.

## 2023-12-11 ENCOUNTER — Encounter (INDEPENDENT_AMBULATORY_CARE_PROVIDER_SITE_OTHER): Payer: Self-pay

## 2023-12-16 ENCOUNTER — Other Ambulatory Visit (HOSPITAL_COMMUNITY): Payer: Self-pay | Admitting: Internal Medicine

## 2023-12-16 DIAGNOSIS — Z1231 Encounter for screening mammogram for malignant neoplasm of breast: Secondary | ICD-10-CM

## 2023-12-17 ENCOUNTER — Ambulatory Visit (HOSPITAL_BASED_OUTPATIENT_CLINIC_OR_DEPARTMENT_OTHER): Payer: Self-pay | Admitting: Anesthesiology

## 2023-12-17 ENCOUNTER — Ambulatory Visit (HOSPITAL_COMMUNITY)
Admission: RE | Admit: 2023-12-17 | Discharge: 2023-12-17 | Disposition: A | Discharge status: Home / Self Care | Attending: Internal Medicine | Admitting: Internal Medicine

## 2023-12-17 ENCOUNTER — Encounter (HOSPITAL_COMMUNITY): Payer: Self-pay | Admitting: Anesthesiology

## 2023-12-17 ENCOUNTER — Encounter (HOSPITAL_COMMUNITY): Payer: Self-pay | Admitting: Internal Medicine

## 2023-12-17 ENCOUNTER — Encounter (HOSPITAL_COMMUNITY)
Admission: RE | Disposition: A | Discharge status: Home / Self Care | Payer: Self-pay | Source: Home / Self Care | Attending: Internal Medicine

## 2023-12-17 ENCOUNTER — Other Ambulatory Visit: Payer: Self-pay

## 2023-12-17 DIAGNOSIS — Z1211 Encounter for screening for malignant neoplasm of colon: Secondary | ICD-10-CM

## 2023-12-17 DIAGNOSIS — Z83719 Family history of colon polyps, unspecified: Secondary | ICD-10-CM | POA: Diagnosis not present

## 2023-12-17 DIAGNOSIS — K635 Polyp of colon: Secondary | ICD-10-CM | POA: Insufficient documentation

## 2023-12-17 DIAGNOSIS — I2510 Atherosclerotic heart disease of native coronary artery without angina pectoris: Secondary | ICD-10-CM | POA: Diagnosis not present

## 2023-12-17 DIAGNOSIS — K648 Other hemorrhoids: Secondary | ICD-10-CM | POA: Insufficient documentation

## 2023-12-17 DIAGNOSIS — E039 Hypothyroidism, unspecified: Secondary | ICD-10-CM | POA: Diagnosis not present

## 2023-12-17 DIAGNOSIS — F17290 Nicotine dependence, other tobacco product, uncomplicated: Secondary | ICD-10-CM | POA: Diagnosis not present

## 2023-12-17 DIAGNOSIS — I10 Essential (primary) hypertension: Secondary | ICD-10-CM | POA: Diagnosis not present

## 2023-12-17 DIAGNOSIS — K649 Unspecified hemorrhoids: Secondary | ICD-10-CM | POA: Diagnosis not present

## 2023-12-17 HISTORY — PX: COLONOSCOPY: SHX5424

## 2023-12-17 SURGERY — COLONOSCOPY
Anesthesia: General

## 2023-12-17 MED ORDER — PROPOFOL 500 MG/50ML IV EMUL
INTRAVENOUS | Status: DC | PRN
  Administered 2023-12-17: 100 mg via INTRAVENOUS
  Administered 2023-12-17: 150 ug/kg/min via INTRAVENOUS

## 2023-12-17 MED ORDER — LACTATED RINGERS IV SOLN: INTRAVENOUS | Status: DC

## 2023-12-17 MED ORDER — LACTATED RINGERS IV SOLN
INTRAVENOUS | Status: DC | PRN
Start: 2023-12-17 — End: 2023-12-17
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
## 2023-12-17 NOTE — Transfer of Care (Signed)
 Immediate Anesthesia Transfer of Care Note  Patient: Patricia [name]  Procedure(s) Performed: COLONOSCOPY  Patient Location: Endoscopy Unit  Anesthesia Type:General  Level of Consciousness: awake  Airway & Oxygen Therapy: Patient Spontanous Breathing  Post-op Assessment: Report given to RN and Post -op Vital signs reviewed and stable  Post vital signs: Reviewed and stable  Last Vitals:  Vitals Value Taken Time  BP 112/72 12/17/23 08:16  Temp 36.5 C 12/17/23 08:16  Pulse 73 12/17/23 08:16  Resp 21 12/17/23 08:16  SpO2 96 % 12/17/23 08:16    Last Pain:  Vitals:   12/17/23 0816  TempSrc: Oral  PainSc: 0-No pain      Patients Stated Pain Goal: 5 (12/17/23 0716)  Complications: No notable events documented.

## 2023-12-17 NOTE — Anesthesia Preprocedure Evaluation (Signed)
 Anesthesia Evaluation  Patient identified by MRN, date of birth, ID band Patient awake    Reviewed: Allergy & Precautions, H&P , NPO status , Patient's Chart, lab work & pertinent test results, reviewed documented beta blocker date and time   Airway Mallampati: II  TM Distance: >3 FB Neck ROM: full    Dental no notable dental hx.    Pulmonary neg pulmonary ROS, former smoker   Pulmonary exam normal breath sounds clear to auscultation       Cardiovascular Exercise Tolerance: Good hypertension, + CAD   Rhythm:regular Rate:Normal     Neuro/Psych negative neurological ROS  negative psych ROS   GI/Hepatic negative GI ROS, Neg liver ROS,,,  Endo/Other  Hypothyroidism    Renal/GU negative Renal ROS  negative genitourinary   Musculoskeletal   Abdominal   Peds  Hematology negative hematology ROS (+)   Anesthesia Other Findings   Reproductive/Obstetrics negative OB ROS                              Anesthesia Physical Anesthesia Plan  ASA: 2  Anesthesia Plan: General   Post-op Pain Management:    Induction:   PONV Risk Score and Plan: Propofol  infusion  Airway Management Planned:   Additional Equipment:   Intra-op Plan:   Post-operative Plan:   Informed Consent: I have reviewed the patients History and Physical, chart, labs and discussed the procedure including the risks, benefits and alternatives for the proposed anesthesia with the patient or authorized representative who has indicated his/her understanding and acceptance.     Dental Advisory Given  Plan Discussed with: CRNA  Anesthesia Plan Comments:         Anesthesia Quick Evaluation

## 2023-12-17 NOTE — Op Note (Signed)
 [facility] Patient Name: Patricia [name] Procedure Date: 12/17/2023 7:49 AM MRN: [id number] Date of Birth: 1970/11/25 Attending MD: Carlin POUR. Cindie , [place], [id number] CSN: [id number] Age: 53 Admit Type: Outpatient Procedure:                Colonoscopy Indications:              Colon cancer screening in patient at increased                            risk: Family history of 1st-degree relative with                            colon polyps before age 30 years Providers:                Carlin POUR. Cindie, DO, Leandrew Edelman RN, RN, Daphne Mulch Technician, Technician Referring MD:              Medicines:                See the Anesthesia note for documentation of the                            administered medications Complications:            No immediate complications. Estimated Blood Loss:     Estimated blood loss was minimal. Procedure:                Pre-Anesthesia Assessment:                           - The anesthesia plan was to use monitored                            anesthesia care (MAC).                           After obtaining informed consent, the colonoscope                            was passed under direct vision. Throughout the                            procedure, the patient's blood pressure, pulse, and                            oxygen saturations were monitored continuously. The                            [id number]-HQ190L ([id number]) scope was introduced through                            the anus and advanced to the the terminal ileum,                            with  identification of the appendiceal orifice and                            IC valve. The colonoscopy was performed without                            difficulty. The patient tolerated the procedure                            well. The quality of the bowel preparation was                            evaluated using the BBPS [facility] Bowel Preparation                            Scale)  with scores of: Right Colon = 3, Transverse                            Colon = 3 and Left Colon = 3 (entire mucosa seen                            well with no residual staining, small fragments of                            stool or opaque liquid). The total BBPS score                            equals 9. Scope In: 8:00:54 AM Scope Out: 8:13:18 AM Scope Withdrawal Time: 0 hours 8 minutes 32 seconds  Total Procedure Duration: 0 hours 12 minutes 24 seconds  Findings:      Non-bleeding internal hemorrhoids were found.      The terminal ileum appeared normal.      A 4 mm polyp was found in the transverse colon. The polyp was flat. The       polyp was removed with a cold snare. Resection and retrieval were       complete.      The exam was otherwise without abnormality. Impression:               - Non-bleeding internal hemorrhoids.                           - The examined portion of the ileum was normal.                           - One 4 mm polyp in the transverse colon, removed                            with a cold snare. Resected and retrieved.                           - The examination was otherwise normal. Moderate Sedation:      Per Anesthesia Care Recommendation:           - Patient has  a contact number available for                            emergencies. The signs and symptoms of potential                            delayed complications were discussed with the                            patient. Return to normal activities tomorrow.                            Written discharge instructions were provided to the                            patient.                           - Resume previous diet.                           - Continue present medications.                           - Await pathology results.                           - Repeat colonoscopy in 5 years for surveillance                            and family history of polyps.                           - Return to GI  clinic PRN. Procedure Code(s):        --- Professional ---                           [phone], Colonoscopy, flexible; with removal of                            tumor(s), polyp(s), or other lesion(s) by snare                            technique Diagnosis Code(s):        --- Professional ---                           Z83.71, Family history of colonic polyps                           K64.8, Other hemorrhoids                           D12.3, Benign neoplasm of transverse colon (hepatic                            flexure or splenic flexure) CPT  copyright 2022 American Medical Association. All rights reserved. The codes documented in this report are preliminary and upon coder review may  be revised to meet current compliance requirements. Carlin POUR. Cindie, DO Carlin POUR. Cindie, DO 12/17/2023 8:18:26 AM This report has been signed electronically. Number of Addenda: 0

## 2023-12-17 NOTE — Discharge Instructions (Addendum)
  Colonoscopy Discharge Instructions  Read the instructions outlined below and refer to this sheet in the next few weeks. These discharge instructions provide you with general information on caring for yourself after you leave the hospital. Your doctor may also give you specific instructions. While your treatment has been planned according to the most current medical practices available, unavoidable complications occasionally occur.   ACTIVITY You may resume your regular activity, but move at a slower pace for the next 24 hours.  Take frequent rest periods for the next 24 hours.  Walking will help get rid of the air and reduce the bloated feeling in your belly (abdomen).  No driving for 24 hours (because of the medicine (anesthesia) used during the test).   Do not sign any important legal documents or operate any machinery for 24 hours (because of the anesthesia used during the test).  NUTRITION Drink plenty of fluids.  You may resume your normal diet as instructed by your doctor.  Begin with a light meal and progress to your normal diet. Heavy or fried foods are harder to digest and may make you feel sick to your stomach (nauseated).  Avoid alcoholic beverages for 24 hours or as instructed.  MEDICATIONS You may resume your normal medications unless your doctor tells you otherwise.  WHAT YOU CAN EXPECT TODAY Some feelings of bloating in the abdomen.  Passage of more gas than usual.  Spotting of blood in your stool or on the toilet paper.  IF YOU HAD POLYPS REMOVED DURING THE COLONOSCOPY: No aspirin products for 7 days or as instructed.  No alcohol for 7 days or as instructed.  Eat a soft diet for the next 24 hours.  FINDING OUT THE RESULTS OF YOUR TEST Not all test results are available during your visit. If your test results are not back during the visit, make an appointment with your caregiver to find out the results. Do not assume everything is normal if you have not heard from your  caregiver or the medical facility. It is important for you to follow up on all of your test results.  SEEK IMMEDIATE MEDICAL ATTENTION IF: You have more than a spotting of blood in your stool.  Your belly is swollen (abdominal distention).  You are nauseated or vomiting.  You have a temperature over 101.  You have abdominal pain or discomfort that is severe or gets worse throughout the day.   Your colonoscopy revealed 1 polyp(s) which I removed successfully. Await pathology results, my office will contact you. I recommend repeating colonoscopy in 5 years for surveillance purposes and family history. Otherwise follow up with GI as needed.     I hope you have a great rest of your week!  Carlin POUR. Cindie, D.O. Gastroenterology and Hepatology [facility]

## 2023-12-18 NOTE — Anesthesia Postprocedure Evaluation (Signed)
 Anesthesia Post Note  Patient: Patricia [name]  Procedure(s) Performed: COLONOSCOPY  Patient location during evaluation: Phase II Anesthesia Type: General Level of consciousness: awake Pain management: pain level controlled Vital Signs Assessment: post-procedure vital signs reviewed and stable Respiratory status: spontaneous breathing and respiratory function stable Cardiovascular status: blood pressure returned to baseline and stable Postop Assessment: no headache and no apparent nausea or vomiting Anesthetic complications: no Comments: Late entry   No notable events documented.   Last Vitals:  Vitals:   12/17/23 0716 12/17/23 0816  BP: 129/76 112/72  Pulse: 75 73  Resp: 16 (!) 21  Temp: 36.6 C 36.5 C  SpO2: 98% 96%    Last Pain:  Vitals:   12/17/23 0816  TempSrc: Oral  PainSc: 0-No pain                 Yvonna JINNY Bosworth

## 2023-12-22 ENCOUNTER — Encounter (HOSPITAL_COMMUNITY): Payer: Self-pay | Admitting: Internal Medicine

## 2023-12-22 LAB — SURGICAL PATHOLOGY

## 2024-01-12 ENCOUNTER — Ambulatory Visit: Payer: Self-pay | Admitting: Internal Medicine

## 2024-01-20 ENCOUNTER — Ambulatory Visit (HOSPITAL_COMMUNITY)

## 2024-02-15 ENCOUNTER — Ambulatory Visit (HOSPITAL_COMMUNITY)
Admission: RE | Admit: 2024-02-15 | Discharge: 2024-02-15 | Disposition: A | Discharge status: Home / Self Care | Source: Ambulatory Visit | Attending: Internal Medicine | Admitting: Internal Medicine

## 2024-02-15 DIAGNOSIS — Z1231 Encounter for screening mammogram for malignant neoplasm of breast: Secondary | ICD-10-CM | POA: Insufficient documentation

## 2024-03-01 ENCOUNTER — Ambulatory Visit (INDEPENDENT_AMBULATORY_CARE_PROVIDER_SITE_OTHER): Admitting: Otolaryngology

## 2024-03-01 VITALS — BP 121/82 | HR 76 | Temp 98.0°F | Ht 67.0 in | Wt 210.0 lb

## 2024-03-01 DIAGNOSIS — H9203 Otalgia, bilateral: Secondary | ICD-10-CM

## 2024-03-01 DIAGNOSIS — M26603 Bilateral temporomandibular joint disorder, unspecified: Secondary | ICD-10-CM | POA: Diagnosis not present
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
